# Patient Record
Sex: Male | Born: 1975 | Race: White | Hispanic: No | Marital: Married | Smoking: Never smoker
Health system: Southern US, Community
[De-identification: ages and names within clinical notes are randomized; demographics above are authoritative.]

## PROBLEM LIST (undated history)

## (undated) DIAGNOSIS — B958 Unspecified staphylococcus as the cause of diseases classified elsewhere: Secondary | ICD-10-CM

## (undated) DIAGNOSIS — E785 Hyperlipidemia, unspecified: Secondary | ICD-10-CM

## (undated) DIAGNOSIS — J309 Allergic rhinitis, unspecified: Secondary | ICD-10-CM

## (undated) DIAGNOSIS — M6530 Trigger finger, unspecified finger: Secondary | ICD-10-CM

## (undated) DIAGNOSIS — T7840XA Allergy, unspecified, initial encounter: Secondary | ICD-10-CM

## (undated) DIAGNOSIS — K649 Unspecified hemorrhoids: Secondary | ICD-10-CM

## (undated) DIAGNOSIS — I341 Nonrheumatic mitral (valve) prolapse: Secondary | ICD-10-CM

## (undated) HISTORY — DX: Allergic rhinitis, unspecified: J30.9

## (undated) HISTORY — DX: Unspecified staphylococcus as the cause of diseases classified elsewhere: B95.8

## (undated) HISTORY — DX: Hyperlipidemia, unspecified: E78.5

## (undated) HISTORY — DX: Unspecified hemorrhoids: K64.9

## (undated) HISTORY — PX: EYE SURGERY: SHX253

## (undated) HISTORY — DX: Trigger finger, unspecified finger: M65.30

## (undated) HISTORY — DX: Allergy, unspecified, initial encounter: T78.40XA

## (undated) HISTORY — DX: Nonrheumatic mitral (valve) prolapse: I34.1

---

## 2004-08-07 ENCOUNTER — Ambulatory Visit (HOSPITAL_COMMUNITY): Admission: RE | Admit: 2004-08-07 | Discharge: 2004-08-07 | Payer: Self-pay | Admitting: *Deleted

## 2009-11-30 HISTORY — PX: SEPTOPLASTY: SUR1290

## 2010-07-09 ENCOUNTER — Emergency Department (HOSPITAL_COMMUNITY): Admission: EM | Admit: 2010-07-09 | Discharge: 2009-11-10 | Payer: Self-pay | Admitting: Emergency Medicine

## 2010-09-01 HISTORY — PX: NASAL SINUS SURGERY: SHX719

## 2011-02-23 ENCOUNTER — Ambulatory Visit (INDEPENDENT_AMBULATORY_CARE_PROVIDER_SITE_OTHER): Payer: 59 | Admitting: General Surgery

## 2011-02-23 ENCOUNTER — Encounter (INDEPENDENT_AMBULATORY_CARE_PROVIDER_SITE_OTHER): Payer: Self-pay | Admitting: General Surgery

## 2011-02-23 VITALS — BP 92/70 | HR 72 | Temp 97.0°F

## 2011-02-23 DIAGNOSIS — K602 Anal fissure, unspecified: Secondary | ICD-10-CM

## 2011-02-23 NOTE — Progress Notes (Signed)
Subjective:     Patient ID: Kevin Chan, male   DOB: 09-Nov-1975, 35 y.o.   MRN: [id number]  HPI  Patient's had previous problems with hemorrhoids R. rubberbanded on 2 occasions area stated went about 3 months with no problems and then recently had a small amount of bleeding and also onset of pain in his bathroom habits are regular. Stools soft  Review of Systems     Objective:   Physical Exam Patient on rectal and anoscopic exam he's got a small fissure posteriorly that appears to be healing and he's not in bed spasm at this time. With anoscopic exam I find no hemorrhoids are large enough to need any rubber banding at this time and think the most recent episode of bleeding was secondary to the small acute posterior anal fissure   Assessment:        Plan:    Continues in the lidocaine ointment for pain antispasm cream can be used if needed and he has a new prescription previously [facility] clinic that he said he had problems with having loose bowel movements no controlling he preferred not use of packed cells during

## 2011-06-03 ENCOUNTER — Encounter: Payer: Self-pay | Admitting: *Deleted

## 2011-06-04 ENCOUNTER — Encounter: Payer: Self-pay | Admitting: Family Medicine

## 2011-06-04 ENCOUNTER — Ambulatory Visit (INDEPENDENT_AMBULATORY_CARE_PROVIDER_SITE_OTHER): Payer: 59 | Admitting: Family Medicine

## 2011-06-04 VITALS — BP 110/70 | HR 80 | Ht 71.5 in | Wt 169.0 lb

## 2011-06-04 DIAGNOSIS — Z0000 Encounter for general adult medical examination without abnormal findings: Secondary | ICD-10-CM

## 2011-06-04 DIAGNOSIS — E7800 Pure hypercholesterolemia, unspecified: Secondary | ICD-10-CM | POA: Insufficient documentation

## 2011-06-04 LAB — COMPREHENSIVE METABOLIC PANEL
ALT: 17 U/L (ref 0–53)
AST: 16 U/L (ref 0–37)
Albumin: 4.8 g/dL (ref 3.5–5.2)
Alkaline Phosphatase: 61 U/L (ref 39–117)
BUN: 21 mg/dL (ref 6–23)
CO2: 31 mEq/L (ref 19–32)
Calcium: 10 mg/dL (ref 8.4–10.5)
Chloride: 101 mEq/L (ref 96–112)
Creat: 0.81 mg/dL (ref 0.50–1.35)
Glucose, Bld: 80 mg/dL (ref 70–99)
Potassium: 4.3 mEq/L (ref 3.5–5.3)
Sodium: 139 mEq/L (ref 135–145)
Total Bilirubin: 0.7 mg/dL (ref 0.3–1.2)
Total Protein: 7.6 g/dL (ref 6.0–8.3)

## 2011-06-04 LAB — LIPID PANEL
Cholesterol: 271 mg/dL — ABNORMAL HIGH (ref 0–200)
HDL: 73 mg/dL (ref >39)
LDL Cholesterol: 169 mg/dL — ABNORMAL HIGH (ref 0–99)
Total CHOL/HDL Ratio: 3.7 Ratio
Triglycerides: 144 mg/dL (ref <150)
VLDL: 29 mg/dL (ref 0–40)

## 2011-06-04 NOTE — Patient Instructions (Signed)

## 2011-06-04 NOTE — Progress Notes (Signed)
Kevin Chan is a 35 y.o. male who presents for a complete physical.  He has the following concerns: Patient presents to follow up on his cholesterol.  Brings in labs from October and December 2011.  In October, he was on no cholesterol medications and LDL was 145, total 238, TG 168, HDL 49. He was started on 20 mg of simvastatin by Dr. Abigail Miyamoto.  Caused a lot of myalgias--hurting in neck, elbows and knees, felt sore like he had been doing yardwork.  Re-check 2 months later was only minimally improved, likely because he didn't take it the entire time.  Dose was recommended to be increased to 40mg , but due to side effects, did not take.  Has been following low cholesterol diet and is here for recheck.  In 2010, C-CRP was low risk (LDL 163, total 256).   Immunization History  Administered Date(s) Administered  . Influenza Split 05/30/2009, 05/27/2010, 05/12/2011  . Tdap 01/13/2009   Last colonoscopy: 2006 years ago with Dr. Luther Parody.  Internal hemorrhoids Last PSA: never Dentist: twice yearly Ophtho: yearly Exercise: "not enough", just walking playing golf  Past Medical History  Diagnosis Date  . Mitral valve prolapse   . Staph infection   . Hemorrhoid     internal  . Hyperlipidemia     previously had myalgias on Crestor and simvastatin  . Asthma   . Allergic rhinitis, cause unspecified     on immunotherapy through [facility]    Past Surgical History  Procedure Date  . Septoplasty 11/2009    turbinate reduction (Dr. Lazarus Salines)  . Nasal sinus surgery 09/01/10    [facility] Bane    History   Social History  . Marital Status: Married    Spouse Name: N/A    Number of Children: 2  . Years of Education: N/A   Occupational History  . financial advisor    Social History Main Topics  . Smoking status: Never Smoker   . Smokeless tobacco: Never Used  . Alcohol Use: 0.0 oz/week    2-3 Cans of beer per week     3 drinks per week, heavier with golf trips  . Drug Use: No  . Sexually  Active: Yes -- Male partner(s)   Other Topics Concern  . Not on file   Social History Narrative   Lives with wife, cat, son and daughter    Family History  Problem Relation Age of Onset  . Heart disease Father     bypass surgery(51 and 61)  . Hyperlipidemia Father   . Diabetes Father     borderline  . Lymphoma Mother   . Colon polyps Mother   . Depression Mother   . Migraines Mother   . Colon polyps Brother   . Cancer Maternal Grandmother 49    colon cancer  . Alzheimer's disease Maternal Grandmother     Current outpatient prescriptions:azelastine (ASTELIN) 137 MCG/SPRAY nasal spray, Place 1 spray into the nose 2 (two) times daily. Use in each nostril as directed , Disp: , Rfl: ;  fexofenadine (ALLEGRA) 180 MG tablet, Take 180 mg by mouth daily.  , Disp: , Rfl: ;  fish oil-omega-3 fatty acids 1000 MG capsule, Take 4 g by mouth daily.  , Disp: , Rfl: ;  fluticasone (FLONASE) 50 MCG/ACT nasal spray, Place 2 sprays into the nose daily.  , Disp: , Rfl:  mometasone (ASMANEX) 220 MCG/INH inhaler, Inhale 1 puff into the lungs daily.  , Disp: , Rfl: ;  Multiple Vitamins-Minerals (MULTIVITAMIN  WITH MINERALS) tablet, Take 1 tablet by mouth daily.  , Disp: , Rfl:   Allergies  Allergen Reactions  . Cashews (Nuts) Other (See Comments)    thrush   ROS: The patient denies anorexia, fever, weight changes (some intentional loss), headaches,  vision loss, decreased hearing, ear pain, hoarseness, chest pain, palpitations, dizziness, syncope, dyspnea on exertion, cough, swelling, nausea, vomiting, diarrhea, constipation, abdominal pain, melena, hematochezia, indigestion/heartburn, hematuria, incontinence, erectile dysfunction, nocturia, weakened urine stream, dysuria, genital lesions, joint pains, numbness, tingling, weakness, tremor, suspicious skin lesions, depression, anxiety, abnormal bleeding/bruising, or enlarged lymph nodes  PHYSICAL EXAM: BP 110/70  Pulse 80  Ht 5' 11.5" (1.816 m)  Wt  169 lb (76.658 kg)  BMI 23.24 kg/m2  General Appearance:    Alert, cooperative, no distress, appears stated age  Head:    Normocephalic, without obvious abnormality, atraumatic  Eyes:    PERRL, conjunctiva/corneas clear, EOM's intact, fundi    benign  Ears:    Normal TM's and external ear canals  Nose:   Nares normal, mucosa normal, no drainage or sinus   tenderness  Throat:   Lips, mucosa, and tongue normal; teeth and gums normal  Neck:   Supple, no lymphadenopathy;  thyroid:  no   enlargement/tenderness/nodules; no carotid   bruit or JVD  Back:    Spine nontender, no curvature, ROM normal, no CVA     tenderness  Lungs:     Clear to auscultation bilaterally without wheezes, rales or     ronchi; respirations unlabored  Chest Wall:    No tenderness or deformity   Heart:    Regular rate and rhythm, S1 and S2 normal, no murmur, rub   or gallop  Breast Exam:    No chest wall tenderness, masses or gynecomastia  Abdomen:     Soft, non-tender, nondistended, normoactive bowel sounds,    no masses, no hepatosplenomegaly  Genitalia:    Normal male external genitalia without lesions. Circumcized. Scrotal piercing. Testicles without masses.  No inguinal hernias.  Rectal:   Deferred due to age <40 and lack of symptoms  Extremities:   No clubbing, cyanosis or edema  Pulses:   2+ and symmetric all extremities  Skin:   Skin color, texture, turgor normal, no rashes or lesions  Lymph nodes:   Cervical, supraclavicular, and axillary nodes normal  Neurologic:   CNII-XII intact, normal strength, sensation and gait; reflexes 2+ and symmetric throughout          Psych:   Normal mood, affect, hygiene and grooming.     ASSESSMENT/PLAN:  1. Routine general medical examination at a health care facility  POCT Urinalysis Dipstick, Visual acuity screening  2. Pure hypercholesterolemia  Comprehensive metabolic panel, Lipid panel, CRP High sensitivity    Recommended at least 30 minutes of aerobic activity at  least 5 days/week; proper sunscreen use reviewed; healthy diet and alcohol recommendations (less than or equal to 2 drinks/day) reviewed; regular seatbelt use; changing batteries in smoke detectors. Self-testicular exams. Immunization recommendations discussed--UTD.  Colonoscopy recommendations reviewed--likely should have repeat at age 16, sooner for changes in bowel habits or other concerns.

## 2011-06-05 ENCOUNTER — Encounter: Payer: Self-pay | Admitting: Family Medicine

## 2011-06-05 LAB — HIGH SENSITIVITY CRP: CRP, High Sensitivity: 0.9 mg/L

## 2011-06-15 ENCOUNTER — Telehealth: Payer: Self-pay | Admitting: Family Medicine

## 2011-06-15 DIAGNOSIS — E7800 Pure hypercholesterolemia, unspecified: Secondary | ICD-10-CM

## 2011-06-15 MED ORDER — ATORVASTATIN CALCIUM 10 MG PO TABS: 10.0000 mg | ORAL_TABLET | Freq: Every day | ORAL | Status: DC

## 2011-06-15 NOTE — Telephone Encounter (Signed)
Rx was sent to pharmacy.  Please advise done

## 2011-06-15 NOTE — Telephone Encounter (Signed)
Pt called wants chol med Rx for Lipitor generic  [facility]

## 2011-06-16 ENCOUNTER — Telehealth: Payer: Self-pay | Admitting: *Deleted

## 2011-06-16 NOTE — Telephone Encounter (Signed)
Patient informed that meds were called in.

## 2011-06-29 ENCOUNTER — Telehealth: Payer: Self-pay | Admitting: Family Medicine

## 2011-06-30 ENCOUNTER — Other Ambulatory Visit: Payer: Self-pay | Admitting: *Deleted

## 2011-06-30 DIAGNOSIS — E7800 Pure hypercholesterolemia, unspecified: Secondary | ICD-10-CM

## 2011-06-30 MED ORDER — ATORVASTATIN CALCIUM 10 MG PO TABS: 10.0000 mg | ORAL_TABLET | Freq: Every day | ORAL | Status: DC

## 2011-07-02 NOTE — Telephone Encounter (Signed)
DONE

## 2011-08-05 ENCOUNTER — Telehealth: Payer: Self-pay | Admitting: Internal Medicine

## 2011-08-05 MED ORDER — ATORVASTATIN CALCIUM 20 MG PO TABS: 10.0000 mg | ORAL_TABLET | Freq: Every day | ORAL | Status: DC

## 2011-08-05 NOTE — Telephone Encounter (Signed)
Spoke with patient and he would like to try putting the rx through to pharmacy as explained. I sent to [facility] #[facility], 1/2/ tablet daily.

## 2011-08-05 NOTE — Telephone Encounter (Signed)
pt needs a refill on lipitor. but wants to know if he can get lipitor 20mg  and split it in half for a 90 day supply so it will be cheaper and last him 180 days. he has been getting lipitor 10 though. please call and let him know if you are able to do that or not. [facility]. Pt number is [phone]

## 2011-08-05 NOTE — Telephone Encounter (Signed)
You can try sending it as he requests, but need to put 1/2 tablet in directions, #90 as quantity.  It is possible that insurance will only let him have #45, in which case he is spending the same amount of money and also has to cut the pills.

## 2011-08-06 ENCOUNTER — Telehealth: Payer: Self-pay | Admitting: Internal Medicine

## 2011-08-06 MED ORDER — ATORVASTATIN CALCIUM 10 MG PO TABS: 10.0000 mg | ORAL_TABLET | Freq: Every day | ORAL | Status: DC

## 2011-08-06 NOTE — Telephone Encounter (Signed)
Ordered lipitor 10mg  #90 no refills to [facility]

## 2011-08-19 ENCOUNTER — Encounter: Payer: Self-pay | Admitting: Family Medicine

## 2011-08-19 ENCOUNTER — Ambulatory Visit (INDEPENDENT_AMBULATORY_CARE_PROVIDER_SITE_OTHER): Payer: 59 | Admitting: Family Medicine

## 2011-08-19 VITALS — BP 128/68 | HR 60 | Temp 98.7°F | Ht 71.5 in | Wt 175.0 lb

## 2011-08-19 DIAGNOSIS — J069 Acute upper respiratory infection, unspecified: Secondary | ICD-10-CM

## 2011-08-19 NOTE — Patient Instructions (Signed)
Drink plenty of fluids.  Continue your regular medications.  Add decongestants and mucinex.  Call or return next week if symptoms are worsening.    Neosporin or bacitracin to wound until it heals. Your tetanus is up to date.

## 2011-08-19 NOTE — Progress Notes (Signed)
Chief complaint:  Cough and sore throat, drainage and achy, mucus is yellowish green x 3 days. No fever  HPI:  3 days ago began with runny nose, sore throat and cough.  Having yellow-green mucus from nose, phlegm is clear.  He is using nasal saline, Flonase and Allegra. Complaining of fatigue. +sick contacts--both of his young children were sick with URI's and pinkeye.  Past Medical History  Diagnosis Date  . Mitral valve prolapse   . Staph infection   . Hemorrhoid     internal  . Hyperlipidemia     previously had myalgias on Crestor and simvastatin  . Asthma   . Allergic rhinitis, cause unspecified     on immunotherapy through [facility]    Past Surgical History  Procedure Date  . Septoplasty 11/2009    turbinate reduction (Dr. Lazarus Salines)  . Nasal sinus surgery 09/01/10    [facility]    History   Social History  . Marital Status: Married    Spouse Name: N/A    Number of Children: 2  . Years of Education: N/A   Occupational History  . financial advisor    Social History Main Topics  . Smoking status: Never Smoker   . Smokeless tobacco: Never Used  . Alcohol Use: 0.0 oz/week    2-3 Cans of beer per week     3 drinks per week, heavier with golf trips  . Drug Use: No  . Sexually Active: Yes -- Male partner(s)   Other Topics Concern  . Not on file   Social History Narrative   Lives with wife, cat, son and daughter    Family History  Problem Relation Age of Onset  . Heart disease Father     bypass surgery(51 and 61)  . Hyperlipidemia Father   . Diabetes Father     borderline  . Lymphoma Mother   . Colon polyps Mother   . Depression Mother   . Migraines Mother   . Hypertension Mother   . Cancer Mother     nonhodgkin's lymphoma  . Colon polyps Brother   . Cancer Maternal Grandmother 20    colon cancer  . Alzheimer's disease Maternal Grandmother    Current Outpatient Prescriptions on File Prior to Visit  Medication Sig Dispense Refill  . atorvastatin  (LIPITOR) 10 MG tablet Take 1 tablet (10 mg total) by mouth daily.  90 tablet  0  . azelastine (ASTELIN) 137 MCG/SPRAY nasal spray Place 1 spray into the nose 2 (two) times daily. Use in each nostril as directed       . fexofenadine (ALLEGRA) 180 MG tablet Take 180 mg by mouth daily.        . fluticasone (FLONASE) 50 MCG/ACT nasal spray Place 2 sprays into the nose daily.        . mometasone (ASMANEX) 220 MCG/INH inhaler Inhale 1 puff into the lungs daily.        . Multiple Vitamins-Minerals (MULTIVITAMIN WITH MINERALS) tablet Take 1 tablet by mouth daily.         Allergies  Allergen Reactions  . Cashews (Nuts) Other (See Comments)    thrush   ROS: Denies fevers and chills. Has some ear pressure with use of saline rinses. Denies shortness of breath, skin rash, GI complaints  PHYSICAL EXAM: BP 128/68  Pulse 60  Temp(Src) 98.7 F (37.1 C) (Oral)  Ht 5' 11.5" (1.816 m)  Wt 175 lb (79.379 kg)  BMI 24.07 kg/m2 Well developed, well appearing male  in no distress HEENT: PERRL, EOMI, conjunctiva clear.  Sinuses nontender.  Nasal mucosa moderately edematous with some white-yellow drainage bilaterally.  TM's and EAC's normal. OP slightly red (anterior tonsillar pillars, and posteriorly with some cobblestoning), normal tonsils. Neck: no lymphadenopathy or mass Heart: regular rate and rhythm Lungs: clear bilaterally Skin: no rash Psych: normal mood  ASSESSMENT/PLAN: 1. URI (upper respiratory infection)   Continue supportive measures--drink plenty of fluids, Mucinex prn, rest. Continue allegra, flonase and sinus rinses  Call or f/u next week if symptoms persist/worsen

## 2011-08-26 ENCOUNTER — Telehealth: Payer: Self-pay | Admitting: Internal Medicine

## 2011-08-26 DIAGNOSIS — J019 Acute sinusitis, unspecified: Secondary | ICD-10-CM

## 2011-08-26 MED ORDER — AMOXICILLIN-POT CLAVULANATE 875-125 MG PO TABS: 1.0000 | ORAL_TABLET | Freq: Two times a day (BID) | ORAL | Status: AC

## 2011-08-26 NOTE — Telephone Encounter (Signed)
Advise pt Augmentin sent to [facility] for him

## 2011-08-26 NOTE — Telephone Encounter (Signed)
Left message for pt letting him know that Dr.Knapp called in Augmentin to [facility] for him.

## 2011-09-06 ENCOUNTER — Telehealth: Payer: Self-pay | Admitting: *Deleted

## 2011-09-06 DIAGNOSIS — J069 Acute upper respiratory infection, unspecified: Secondary | ICD-10-CM

## 2011-09-06 MED ORDER — METHYLPREDNISOLONE 4 MG PO KIT: PACK | ORAL | Status: AC

## 2011-09-06 MED ORDER — AMOXICILLIN-POT CLAVULANATE 875-125 MG PO TABS: 1.0000 | ORAL_TABLET | Freq: Two times a day (BID) | ORAL | Status: AC

## 2011-09-06 NOTE — Telephone Encounter (Signed)
Patient called to say that he thinks he still has infection, started feeling better after the fourth day of Augmentin but then symptoms came back. His nasal discharge is mostly clear but has really bad sinus pressure, can he have some type of steroid to help clear this up, either injection or oral? Please advise. Thanks.

## 2011-09-06 NOTE — Telephone Encounter (Signed)
Did he complete 10 day course of Augmentin? If so, and symptoms recurring since off meds, call in additional 7 days of Augmentin.  Okay to also call in medrol dosepak.  He needs to f/u if symptoms don't resolve (no additional meds to be called in without OV)

## 2011-09-06 NOTE — Telephone Encounter (Signed)
Spoke with patient and he did indeed finish the 10 day course of Augmentin. His symptoms have recurred so I will call in another 7 days and also call in a medrol dosepak. He understands that if his symptoms do not improve after this round of meds that he needs to schedule office visit with Dr.Knapp to follow up. Meds called into [facility].

## 2011-09-10 ENCOUNTER — Other Ambulatory Visit: Payer: 59

## 2011-09-10 ENCOUNTER — Other Ambulatory Visit: Payer: Self-pay | Admitting: Family Medicine

## 2011-09-10 DIAGNOSIS — E7800 Pure hypercholesterolemia, unspecified: Secondary | ICD-10-CM

## 2011-09-11 LAB — LIPID PANEL
Cholesterol: 200 mg/dL (ref 0–200)
HDL: 53 mg/dL (ref >39)
LDL Cholesterol: 107 mg/dL — ABNORMAL HIGH (ref 0–99)
Total CHOL/HDL Ratio: 3.8 Ratio
Triglycerides: 200 mg/dL — ABNORMAL HIGH (ref <150)
VLDL: 40 mg/dL (ref 0–40)

## 2011-09-13 LAB — HEPATIC FUNCTION PANEL
ALT: 20 U/L (ref 0–53)
AST: 14 U/L (ref 0–37)
Albumin: 4.5 g/dL (ref 3.5–5.2)
Alkaline Phosphatase: 58 U/L (ref 39–117)
Bilirubin, Direct: 0.1 mg/dL (ref 0.0–0.3)
Indirect Bilirubin: 0.6 mg/dL (ref 0.0–0.9)
Total Bilirubin: 0.7 mg/dL (ref 0.3–1.2)
Total Protein: 7.1 g/dL (ref 6.0–8.3)

## 2011-12-03 ENCOUNTER — Other Ambulatory Visit: Payer: Self-pay | Admitting: Family Medicine

## 2011-12-03 DIAGNOSIS — E7800 Pure hypercholesterolemia, unspecified: Secondary | ICD-10-CM

## 2011-12-03 NOTE — Telephone Encounter (Signed)
Done.  Labs and OV due again in August

## 2011-12-03 NOTE — Telephone Encounter (Signed)
Is this ok he had labs done in Feb. But I dont see an office visit after

## 2012-01-31 ENCOUNTER — Encounter: Payer: Self-pay | Admitting: Family Medicine

## 2012-01-31 ENCOUNTER — Ambulatory Visit (INDEPENDENT_AMBULATORY_CARE_PROVIDER_SITE_OTHER): Payer: 59 | Admitting: Family Medicine

## 2012-01-31 ENCOUNTER — Telehealth: Payer: Self-pay | Admitting: *Deleted

## 2012-01-31 VITALS — BP 102/70 | HR 64 | Temp 98.2°F | Ht 71.5 in | Wt 175.0 lb

## 2012-01-31 DIAGNOSIS — J019 Acute sinusitis, unspecified: Secondary | ICD-10-CM

## 2012-01-31 DIAGNOSIS — E7800 Pure hypercholesterolemia, unspecified: Secondary | ICD-10-CM

## 2012-01-31 MED ORDER — AZITHROMYCIN 500 MG PO TABS: 500.0000 mg | ORAL_TABLET | Freq: Every day | ORAL | Status: AC

## 2012-01-31 NOTE — Telephone Encounter (Signed)
Patient called and wanted to know if you would call him in an abx for a sinus infection? He has had pain/pressure and drainage 7-10 that is not going away. He has used netti pot along with Flonase. I offered appt and he stated that sometimes you just call something in for him, wanted to see if you were able to do so, if not he stated that he would come in. He is requesting Augmentin and a steroid pack. Thanks. [facility] pharmacy.

## 2012-01-31 NOTE — Telephone Encounter (Signed)
appt today

## 2012-01-31 NOTE — Patient Instructions (Addendum)
Take the azithromycin for three days.  Remember that it continues to work for a full course.  Continue sinus rinses twice daily (can decrease to once daily when symptoms are better).  Continue topical steroid for poison ivy. If you start get new lesions, or spreading, then call for oral steroids.

## 2012-01-31 NOTE — Progress Notes (Signed)
Chief Complaint  Patient presents with  . Sinusitis    sinus infection 7-10 days. Netti pot and flonase not helping. Also has had poison ivy x 10 days, using clobetasol but not working.    HPI: Began a week or so ago with some achiness in his sinuses/cheeks, pain with leaning forward.  At that time, didn't have any purulent drainage or fevers. Has been using flonase and sinus rinses regularly.  The past 2-3 days has gotten some yellow mucus when he blows nose, and after sinus rinses.  This morning, had severe pain when doing sinus rinse this morning, had a lot of resistance when squirting up the left nostril, and had pain into his teeth.  Got out big, thick, nasty yellow "goober", and was very painful into L cheek.  Did rinses 3 times today, didn't seem to hurt as much, but continued to get discolored mucus.  Denies fevers.  Denies sore throat, cough, shortness of breath. He has been compliant with medications and allergy shots.  Got poison ivy 2 weeks ago from yardwork--L hip, right axilla, R lower leg, some on chest. Using clobetasol, and slowly is helping.  Hasn't had any new lesions develop, seems to be improving.  Past Medical History  Diagnosis Date  . Mitral valve prolapse   . Staph infection   . Hemorrhoid     internal  . Hyperlipidemia     previously had myalgias on Crestor and simvastatin  . Asthma   . Allergic rhinitis, cause unspecified     on immunotherapy through [facility]   Past Surgical History  Procedure Date  . Septoplasty 11/2009    turbinate reduction (Dr. Lazarus Salines)  . Nasal sinus surgery 09/01/10    [facility]   Current Outpatient Prescriptions on File Prior to Visit  Medication Sig Dispense Refill  . atorvastatin (LIPITOR) 10 MG tablet TAKE 1 TABLET (10 MG TOTAL) BY MOUTH DAILY.  90 tablet  0  . azelastine (ASTELIN) 137 MCG/SPRAY nasal spray Place 1 spray into the nose 2 (two) times daily. Use in each nostril as directed       . fexofenadine (ALLEGRA) 180 MG tablet  Take 180 mg by mouth daily.        . fluticasone (FLONASE) 50 MCG/ACT nasal spray Place 2 sprays into the nose daily.        . mometasone (ASMANEX) 220 MCG/INH inhaler Inhale 1 puff into the lungs daily.        . Multiple Vitamins-Minerals (MULTIVITAMIN WITH MINERALS) tablet Take 1 tablet by mouth daily.         Allergies  Allergen Reactions  . Cashews (Nuts) Other (See Comments)    thrush   ROS:  Denies fevers, sore throat, cough, shortness of breath, myalgias, rashes other than poison ivy exposure, vomiting, diarrhea, or other concerns.  PHYSICAL EXAM: BP 102/70  Pulse 64  Temp 98.2 F (36.8 C) (Oral)  Ht 5' 11.5" (1.816 m)  Wt 175 lb (79.379 kg)  BMI 24.07 kg/m2 Well developed, well appearing male in no distress HEENT:  PERRL, EOMI, conjunctiva clear.  TM's and EAC's normal. Nasal mucosa appears normal.  Mild tenderness at left maxillary sinus.  OP clear, mild cobblestoning posteriorly Neck: no lymphadenopathy or mass Heart: regular rate and rhythm without murmur Lungs: clear bilaterally Skin: L hip--patch of erythema, healing.  No true vesicles left.  Some scattered healing vesicles/papules on chest, leg, arm.  ASSESSMENT/PLAN: 1. Acute sinusitis  azithromycin (ZITHROMAX) 500 MG tablet  2. Pure  hypercholesterolemia  Lipid panel, Hepatic function panel   Fasting labs due in August (labs only, letter if normal)

## 2012-02-09 ENCOUNTER — Telehealth: Payer: Self-pay | Admitting: *Deleted

## 2012-02-09 MED ORDER — PREDNISONE 10 MG PO TABS: ORAL_TABLET | ORAL | Status: DC

## 2012-02-09 NOTE — Telephone Encounter (Signed)
Please advise pt that rx was sent--and please review directions and make sure he understands (60/50/40/30/20/20/20/10/10/10--see how directions were written on label and make sure it is clear to him)

## 2012-02-09 NOTE — Telephone Encounter (Signed)
Pt advised.

## 2012-02-09 NOTE — Telephone Encounter (Signed)
Patient called and stated that his poison ivy is not really going away with steroid cream. Could you please call in steroid pack.

## 2012-03-04 ENCOUNTER — Other Ambulatory Visit: Payer: Self-pay | Admitting: Family Medicine

## 2012-03-07 ENCOUNTER — Other Ambulatory Visit: Payer: 59

## 2012-03-07 DIAGNOSIS — E7800 Pure hypercholesterolemia, unspecified: Secondary | ICD-10-CM

## 2012-03-07 LAB — LIPID PANEL
Cholesterol: 171 mg/dL (ref 0–200)
HDL: 46 mg/dL (ref >39)
LDL Cholesterol: 88 mg/dL (ref 0–99)
Total CHOL/HDL Ratio: 3.7 Ratio
Triglycerides: 185 mg/dL — ABNORMAL HIGH (ref <150)
VLDL: 37 mg/dL (ref 0–40)

## 2012-03-07 LAB — HEPATIC FUNCTION PANEL
ALT: 20 U/L (ref 0–53)
AST: 14 U/L (ref 0–37)
Albumin: 4.5 g/dL (ref 3.5–5.2)
Alkaline Phosphatase: 56 U/L (ref 39–117)
Bilirubin, Direct: 0.1 mg/dL (ref 0.0–0.3)
Indirect Bilirubin: 0.5 mg/dL (ref 0.0–0.9)
Total Bilirubin: 0.6 mg/dL (ref 0.3–1.2)
Total Protein: 6.9 g/dL (ref 6.0–8.3)

## 2012-03-08 ENCOUNTER — Encounter: Payer: Self-pay | Admitting: Family Medicine

## 2012-05-26 ENCOUNTER — Other Ambulatory Visit: Payer: Self-pay | Admitting: *Deleted

## 2012-05-26 DIAGNOSIS — E7800 Pure hypercholesterolemia, unspecified: Secondary | ICD-10-CM

## 2012-05-26 MED ORDER — ATORVASTATIN CALCIUM 10 MG PO TABS: 10.0000 mg | ORAL_TABLET | Freq: Every day | ORAL | Status: DC

## 2012-11-23 ENCOUNTER — Telehealth: Payer: Self-pay | Admitting: Family Medicine

## 2012-11-23 DIAGNOSIS — E7800 Pure hypercholesterolemia, unspecified: Secondary | ICD-10-CM

## 2012-11-23 MED ORDER — ATORVASTATIN CALCIUM 10 MG PO TABS: 10.0000 mg | ORAL_TABLET | Freq: Every day | ORAL | Status: DC

## 2012-11-23 NOTE — Telephone Encounter (Signed)
done

## 2013-01-29 ENCOUNTER — Ambulatory Visit (INDEPENDENT_AMBULATORY_CARE_PROVIDER_SITE_OTHER): Payer: BC Managed Care – PPO | Admitting: Family Medicine

## 2013-01-29 ENCOUNTER — Telehealth: Payer: Self-pay | Admitting: *Deleted

## 2013-01-29 DIAGNOSIS — Z5320 Procedure and treatment not carried out because of patient's decision for unspecified reasons: Secondary | ICD-10-CM

## 2013-01-29 NOTE — Progress Notes (Signed)
Patient ID: Kevin Chan, male   DOB: 05/19/1976, 37 y.o.   MRN: [id number]  Patient left without being seen today

## 2013-01-29 NOTE — Telephone Encounter (Signed)
Spoke with patient and he told me that he rescheduled already to see Dr.Knapp and he would prefer her to manage if possible.

## 2013-01-29 NOTE — Telephone Encounter (Signed)
Patient was scheduled for sinus infection visit @ 12:00. Had to leave due to having to be somewhere. Wants to know if he should reschedule or if you can call him in an abx and/or prednisone. He states that he has a sinus infection, drainage and slight cough. He just finished a round of doxy from his ENT but doesn't feel 100%.

## 2013-01-29 NOTE — Telephone Encounter (Signed)
He should probably f/u with his ENT, since they recently treated him.  Doubt that it would be an infection if just treated, unless length of treatment wasn't enough.  He should probably try running it by his ENT first, to see if they will manage, otherwise needs to r/s with me

## 2013-01-31 ENCOUNTER — Ambulatory Visit: Payer: BC Managed Care – PPO | Admitting: Family Medicine

## 2013-02-12 ENCOUNTER — Telehealth: Payer: Self-pay | Admitting: Internal Medicine

## 2013-02-12 DIAGNOSIS — E7800 Pure hypercholesterolemia, unspecified: Secondary | ICD-10-CM

## 2013-02-12 MED ORDER — ATORVASTATIN CALCIUM 10 MG PO TABS: 10.0000 mg | ORAL_TABLET | Freq: Every day | ORAL | Status: DC

## 2013-02-12 NOTE — Telephone Encounter (Signed)
Refill request for atorvastatin 10mg  #90 to [facility]

## 2013-02-12 NOTE — Telephone Encounter (Signed)
Done and pt scheduled CPE for 07/05/13, he will come in fasting.

## 2013-05-28 ENCOUNTER — Telehealth: Payer: Self-pay | Admitting: Family Medicine

## 2013-05-28 ENCOUNTER — Other Ambulatory Visit: Payer: Self-pay | Admitting: *Deleted

## 2013-05-28 DIAGNOSIS — E7800 Pure hypercholesterolemia, unspecified: Secondary | ICD-10-CM

## 2013-05-28 MED ORDER — ATORVASTATIN CALCIUM 10 MG PO TABS: 10.0000 mg | ORAL_TABLET | Freq: Every day | ORAL | Status: DC

## 2013-05-28 NOTE — Telephone Encounter (Signed)
Done

## 2013-05-28 NOTE — Telephone Encounter (Signed)
[facility] req refill Atorvastatin Calcium Tab 90 day supply

## 2013-07-05 ENCOUNTER — Ambulatory Visit (INDEPENDENT_AMBULATORY_CARE_PROVIDER_SITE_OTHER): Payer: BC Managed Care – PPO | Admitting: Family Medicine

## 2013-07-05 ENCOUNTER — Encounter: Payer: Self-pay | Admitting: Family Medicine

## 2013-07-05 VITALS — BP 108/70 | HR 68 | Ht 71.5 in | Wt 171.0 lb

## 2013-07-05 DIAGNOSIS — Z0000 Encounter for general adult medical examination without abnormal findings: Secondary | ICD-10-CM

## 2013-07-05 DIAGNOSIS — E7800 Pure hypercholesterolemia, unspecified: Secondary | ICD-10-CM

## 2013-07-05 LAB — COMPREHENSIVE METABOLIC PANEL
ALT: 30 U/L (ref 0–53)
AST: 17 U/L (ref 0–37)
Albumin: 4.4 g/dL (ref 3.5–5.2)
Alkaline Phosphatase: 61 U/L (ref 39–117)
BUN: 20 mg/dL (ref 6–23)
CO2: 26 mEq/L (ref 19–32)
Calcium: 9.2 mg/dL (ref 8.4–10.5)
Chloride: 104 mEq/L (ref 96–112)
Creat: 0.7 mg/dL (ref 0.50–1.35)
Glucose, Bld: 89 mg/dL (ref 70–99)
Potassium: 4.2 mEq/L (ref 3.5–5.3)
Sodium: 139 mEq/L (ref 135–145)
Total Bilirubin: 0.4 mg/dL (ref 0.3–1.2)
Total Protein: 6.8 g/dL (ref 6.0–8.3)

## 2013-07-05 LAB — LIPID PANEL
Cholesterol: 173 mg/dL (ref 0–200)
HDL: 55 mg/dL (ref >39)
LDL Cholesterol: 99 mg/dL (ref 0–99)
Total CHOL/HDL Ratio: 3.1 Ratio
Triglycerides: 93 mg/dL (ref <150)
VLDL: 19 mg/dL (ref 0–40)

## 2013-07-05 NOTE — Progress Notes (Signed)
Chief Complaint  Patient presents with  . Annual Exam    annual exam, labs drawn this am. No  major concerns. All meds reconciled. Is scheduled for vasectomy consult in January with Dr.Dahlstedt.    Kevin Chan is a 37 y.o. male who presents for a complete physical.  He is also here to follow up on hyperlipidemia, past due for fasting labs.  He is following low cholesterol diet, and compliant with meds without side effects.  Immunization History  Administered Date(s) Administered  . Influenza Split 05/30/2009, 05/27/2010, 05/12/2011  . Tdap 01/13/2009   Last colonoscopy: 2006 with Dr. Luther Parody. Internal hemorrhoids  Last PSA: never  Dentist: twice yearly  Ophtho: yearly  Exercise: "not enough", just walking playing golf (once a week, but not during winter)  Past Medical History  Diagnosis Date  . Mitral valve prolapse   . Staph infection   . Hemorrhoid     internal  . Hyperlipidemia     previously had myalgias on Crestor and simvastatin  . Asthma   . Allergic rhinitis, cause unspecified     on immunotherapy through [place]    Past Surgical History  Procedure Laterality Date  . Septoplasty  11/2009    turbinate reduction (Dr. Lazarus Salines)  . Nasal sinus surgery  09/01/10    Kendell Bane    History   Social History  . Marital Status: Married    Spouse Name: N/A    Number of Children: 2  . Years of Education: N/A   Occupational History  . financial advisor    Social History Main Topics  . Smoking status: Never Smoker   . Smokeless tobacco: Never Used  . Alcohol Use: 0.0 oz/week    2-3 Cans of beer per week     Comment: 1-3 drinks per week, heavier with golf trips  . Drug Use: No  . Sexual Activity: Yes    Partners: Female   Other Topics Concern  . Not on file   Social History Narrative   Lives with wife, cat, son and daughter    Family History  Problem Relation Age of Onset  . Heart disease Father     bypass surgery(51 and 61)  . Hyperlipidemia Father    . Diabetes Father     borderline, improved after weight loss  . Lymphoma Mother   . Colon polyps Mother   . Depression Mother   . Migraines Mother   . Hypertension Mother   . Cancer Mother     nonhodgkin's lymphoma  . Colon polyps Brother 7  . Cancer Maternal Grandmother 61    colon cancer  . Alzheimer's disease Maternal Grandmother     Current outpatient prescriptions:amoxicillin-clavulanate (AUGMENTIN) 875-125 MG per tablet, , Disp: , Rfl: ;  atorvastatin (LIPITOR) 10 MG tablet, Take 1 tablet (10 mg total) by mouth daily., Disp: 90 tablet, Rfl: 0;  Coenzyme Q10 300 MG CAPS, Take 600 mg by mouth as needed., Disp: , Rfl: ;  fexofenadine (ALLEGRA) 180 MG tablet, Take 180 mg by mouth daily.  , Disp: , Rfl:  fluticasone (FLONASE) 50 MCG/ACT nasal spray, Place 2 sprays into the nose daily.  , Disp: , Rfl: ;  mometasone (ASMANEX) 220 MCG/INH inhaler, Inhale 1 puff into the lungs daily.  , Disp: , Rfl: ;  montelukast (SINGULAIR) 10 MG tablet, Take 10 mg by mouth at bedtime. , Disp: , Rfl: ;  sulfacetamide (BLEPH-10) 10 % ophthalmic solution, Eye drops for pink eye, Disp: , Rfl:  azelastine (ASTELIN) 137 MCG/SPRAY nasal spray, Place 1 spray into the nose 2 (two) times daily. Use in each nostril as directed , Disp: , Rfl: ;  clobetasol cream (TEMOVATE) 0.05 %, Apply 1 application topically 2 (two) times daily., Disp: , Rfl: ;  EPIPEN 2-PAK 0.3 MG/0.3ML SOAJ injection, , Disp: , Rfl: ;  Multiple Vitamins-Minerals (MULTIVITAMIN WITH MINERALS) tablet, Take 1 tablet by mouth daily.  , Disp: , Rfl:   Allergies  Allergen Reactions  . Cashews [Nuts] Other (See Comments)    thrush   ROS: The patient denies anorexia, fever, weight changes, headaches, vision loss, decreased hearing, ear pain, hoarseness, chest pain, palpitations, dizziness, syncope, dyspnea on exertion, cough, swelling, nausea, vomiting, diarrhea, constipation, abdominal pain, melena, hematochezia, indigestion/heartburn, hematuria,  incontinence, erectile dysfunction, nocturia, weakened urine stream, dysuria, genital lesions, joint pains, numbness, tingling, weakness, tremor, suspicious skin lesions, depression, anxiety, abnormal bleeding/bruising, or enlarged lymph nodes Allergies and asthma are well controlled with his current regimen Tired--related to wife's snoring and 2 young children  PHYSICAL EXAM: BP 108/70  Pulse 68  Ht 5' 11.5" (1.816 m)  Wt 171 lb (77.565 kg)  BMI 23.52 kg/m2  General Appearance:  Alert, cooperative, no distress, appears stated age   Head:  Normocephalic, without obvious abnormality, atraumatic   Eyes:  PERRL, conjunctiva/corneas clear, EOM's intact, fundi  benign   Ears:  Normal TM's and external ear canals   Nose:  Nares normal, mucosa normal, no drainage or sinus tenderness   Throat:  Lips, mucosa, and tongue normal; teeth and gums normal   Neck:  Supple, no lymphadenopathy; thyroid: no enlargement/tenderness/nodules; no carotid  bruit or JVD   Back:  Spine nontender, no curvature, ROM normal, no CVA tenderness   Lungs:  Clear to auscultation bilaterally without wheezes, rales or ronchi; respirations unlabored   Chest Wall:  No tenderness or deformity   Heart:  Regular rate and rhythm, S1 and S2 normal, no murmur, rub  or gallop   Breast Exam:  No chest wall tenderness, masses or gynecomastia   Abdomen:  Soft, non-tender, nondistended, normoactive bowel sounds,  no masses, no hepatosplenomegaly   Genitalia:  Normal male external genitalia without lesions. Circumcized. Scrotal piercing. Testicles without masses. No inguinal hernias.   Rectal:  Deferred due to age <40 and lack of symptoms   Extremities:  No clubbing, cyanosis or edema   Pulses:  2+ and symmetric all extremities   Skin:  Skin color, texture, turgor normal, no rashes or lesions   Lymph nodes:  Cervical, supraclavicular, and axillary nodes normal   Neurologic:  CNII-XII intact, normal strength, sensation and gait;  reflexes 2+ and symmetric throughout          Psych: Normal mood, affect, hygiene and grooming.   ASSESSMENT/PLAN:  Routine general medical examination at a health care facility - Plan: Visual acuity screening, Comprehensive metabolic panel  Pure hypercholesterolemia - Plan: Lipid panel, Comprehensive metabolic panel  Recommended at least 30 minutes of aerobic activity at least 5 days/week; proper sunscreen use reviewed; healthy diet and alcohol recommendations (less than or equal to 2 drinks/day) reviewed; regular seatbelt use; changing batteries in smoke detectors. Self-testicular exams. Immunization recommendations discussed--UTD. Colonoscopy recommendations reviewed--likely should have repeat at age 5, sooner for changes in bowel habits or other concerns. Recommend that patient check with [facility] for their recommendations.  If lipids are high, pt prefers dietary trial and recheck rather than changing dose

## 2013-07-05 NOTE — Patient Instructions (Signed)

## 2013-07-30 ENCOUNTER — Ambulatory Visit (INDEPENDENT_AMBULATORY_CARE_PROVIDER_SITE_OTHER): Payer: BC Managed Care – PPO | Admitting: Family Medicine

## 2013-07-30 ENCOUNTER — Encounter: Payer: Self-pay | Admitting: Family Medicine

## 2013-07-30 VITALS — BP 110/70 | HR 72 | Temp 99.0°F | Wt 170.0 lb

## 2013-07-30 DIAGNOSIS — R05 Cough: Secondary | ICD-10-CM

## 2013-07-30 DIAGNOSIS — R059 Cough, unspecified: Secondary | ICD-10-CM

## 2013-07-30 DIAGNOSIS — J019 Acute sinusitis, unspecified: Secondary | ICD-10-CM

## 2013-07-30 DIAGNOSIS — J209 Acute bronchitis, unspecified: Secondary | ICD-10-CM

## 2013-07-30 MED ORDER — BENZONATATE 200 MG PO CAPS: 200.0000 mg | ORAL_CAPSULE | Freq: Three times a day (TID) | ORAL | Status: DC | PRN

## 2013-07-30 MED ORDER — AZITHROMYCIN 250 MG PO TABS: ORAL_TABLET | ORAL | Status: DC

## 2013-07-30 MED ORDER — HYDROCOD POLST-CHLORPHEN POLST 10-8 MG/5ML PO LQCR: 5.0000 mL | Freq: Every evening | ORAL | Status: DC | PRN

## 2013-07-30 NOTE — Patient Instructions (Signed)
  Sinusitis/bronchitis as complication from recent influenza.  Treat with z-pak. Take 2 tablets today, then 1 daily for the next 4 days.  It continues to work for a total of 10 days. Continue mucinex to help loosen the phlegm. Drink plenty of fluids. Take the tussionex at bedtime if needed for cough--will make you sleepy and lasts for 12 hours, be careful with early morning driving. You can use the tessalon during the day as needed for cough (won't cause sedation).

## 2013-07-30 NOTE — Progress Notes (Signed)
Chief Complaint  Patient presents with  . lingering cough    lingering cough little over week. whole family is sick, pt got tamiflu and finished it up on christmas and still not better   He took Tamiflu from 12/20-25 (after he developed symptoms within a day of his daughter being diagnosed with influenza A).  Coughing seemed to get worse toward the end of the Tamiflu course, and persists.  He feels a rattle in his chest at night, having a lot of cough.  Cough is less productive than before and energy is improving, but still getting some discolored mucus and phlegm.  He has been taking Mucinex.  Stopped Delsym about 3-4 days ago. He has needed his rescue inhaler some (at night, 12/22-25).  He had high fevers with the flu, but feels like fevers had completely resolved, but periodically feels hot and sweaty.  No longer achey.  Has some ear fullness bilaterally.  Nasal mucus is 40% yellow-green, and the rest is clear.  Phlegm is about the same.  He has a lingering cough, and was sent home from work by his colleagues.  Past Medical History  Diagnosis Date  . Mitral valve prolapse   . Staph infection   . Hemorrhoid     internal  . Hyperlipidemia     previously had myalgias on Crestor and simvastatin  . Asthma   . Allergic rhinitis, cause unspecified     on immunotherapy through [place] [facility]   Past Surgical History  Procedure Laterality Date  . Septoplasty  11/2009    turbinate reduction (Dr. Lazarus Salines)  . Nasal sinus surgery  09/01/10    Kendell Bane   History   Social History  . Marital Status: Married    Spouse Name: N/A    Number of Children: 2  . Years of Education: N/A   Occupational History  . financial advisor    Social History Main Topics  . Smoking status: Never Smoker   . Smokeless tobacco: Never Used  . Alcohol Use: 0.0 oz/week    2-3 Cans of beer per week     Comment: 1-3 drinks per week, heavier with golf trips  . Drug Use: No  . Sexual Activity: Yes    Partners: Female    Other Topics Concern  . Not on file   Social History Narrative   Lives with wife, cat, son and daughter   Current outpatient prescriptions:atorvastatin (LIPITOR) 10 MG tablet, Take 1 tablet (10 mg total) by mouth daily., Disp: 90 tablet, Rfl: 0;  azelastine (ASTELIN) 137 MCG/SPRAY nasal spray, Place 1 spray into the nose 2 (two) times daily. Use in each nostril as directed , Disp: , Rfl: ;  Coenzyme Q10 300 MG CAPS, Take 600 mg by mouth as needed., Disp: , Rfl: ;  EPIPEN 2-PAK 0.3 MG/0.3ML SOAJ injection, , Disp: , Rfl:  fexofenadine (ALLEGRA) 180 MG tablet, Take 180 mg by mouth daily.  , Disp: , Rfl: ;  fluticasone (FLONASE) 50 MCG/ACT nasal spray, Place 2 sprays into the nose daily.  , Disp: , Rfl: ;  mometasone (ASMANEX) 220 MCG/INH inhaler, Inhale 1 puff into the lungs daily.  , Disp: , Rfl: ;  montelukast (SINGULAIR) 10 MG tablet, Take 10 mg by mouth at bedtime. , Disp: , Rfl:  Multiple Vitamins-Minerals (MULTIVITAMIN WITH MINERALS) tablet, Take 1 tablet by mouth daily.  , Disp: , Rfl:   Allergies  Allergen Reactions  . Cashews [Nuts] Other (See Comments)    thrush  ROS:  Denies headache, dizziness, chest pain, nausea, vomiting, diarrhea, bleeding/bruising.  +cough, fevers resolved, body aches improved, energy improving.  PHYSICAL EXAM: BP 110/70  Pulse 72  Temp(Src) 99 F (37.2 C) (Oral)  Wt 170 lb (77.111 kg) Dry, hacky cough.  Speaking easily, in no distress HEENT:  PERRL, EOMI, conjunctiva clear. TM's and EAC's normal. Nasal mucosa moderately edematous, some erythema, thick whitish yellow mucus.  OP with erythema posteriorly moist mucus membranes. Sinuses nontender Neck: no lymphadenopathy, thyromegaly or mass Heart: regular rate and rhythm without murmur Lungs: clear bilaterally.  No wheezes, rales or ronchi.  Some cough with forced expiration. Skin: no rash or bruising Neuro: alert and oriented, cranial nerves intact  ASSESSMENT/PLAN:  Acute sinusitis - Plan:  azithromycin (ZITHROMAX) 250 MG tablet  Acute bronchitis - Plan: azithromycin (ZITHROMAX) 250 MG tablet  Cough - Plan: benzonatate (TESSALON) 200 MG capsule, chlorpheniramine-HYDROcodone (TUSSIONEX PENNKINETIC ER) 10-8 MG/5ML LQCR  Sinusitis/bronchitis as complication from recent influenza.  Treat with z-pak. Take 2 tablets today, then 1 daily for the next 4 days.  It continues to work for a total of 10 days. Continue mucinex to help loosen the phlegm. Drink plenty of fluids. Take the tussionex at bedtime if needed for cough--will make you sleepy and lasts for 12 hours, be careful with early morning driving. You can use the tessalon during the day as needed for cough (won't cause sedation).

## 2013-08-02 HISTORY — PX: VASECTOMY: SHX75

## 2013-08-20 ENCOUNTER — Other Ambulatory Visit: Payer: Self-pay | Admitting: Family Medicine

## 2014-02-19 DIAGNOSIS — Z0279 Encounter for issue of other medical certificate: Secondary | ICD-10-CM

## 2014-03-05 ENCOUNTER — Telehealth: Payer: Self-pay | Admitting: Internal Medicine

## 2014-03-05 NOTE — Telephone Encounter (Signed)
Faxed over medical records to [facility] @ [phone] 

## 2014-08-27 ENCOUNTER — Telehealth: Payer: Self-pay | Admitting: Internal Medicine

## 2014-08-27 DIAGNOSIS — E7800 Pure hypercholesterolemia, unspecified: Secondary | ICD-10-CM

## 2014-08-27 NOTE — Telephone Encounter (Signed)
Refill request for [facility]itor to [facility]. Looks like patient has an cpe scheduled

## 2014-08-28 MED ORDER — ATORVASTATIN CALCIUM 10 MG PO TABS: ORAL_TABLET | ORAL | Status: DC

## 2014-08-28 NOTE — Telephone Encounter (Signed)
This was sent to me. He has CPE for 01/2015, but has not had lipid panel since 2014. Should he schedule med check or come in for labs or is he okay to refill until June?

## 2014-08-30 ENCOUNTER — Other Ambulatory Visit: Payer: Self-pay | Admitting: Family Medicine

## 2014-09-05 ENCOUNTER — Telehealth: Payer: Self-pay | Admitting: Internal Medicine

## 2014-09-05 NOTE — Telephone Encounter (Signed)
Referral submitted and patient notified.

## 2014-09-05 NOTE — Telephone Encounter (Signed)
Pt states that his insurance now requires referrals and he can not set up an appt with Dr. Emeline DarlingGore at [facility] for his sinus issues until he has that referral done. Pt insurance is [employer]. ID #- [id number] and Group # [id number]. Pt is suppose to be sending over his insurance card for us to scan into the computer

## 2014-12-04 ENCOUNTER — Other Ambulatory Visit: Payer: Self-pay | Admitting: Family Medicine

## 2014-12-06 ENCOUNTER — Ambulatory Visit (INDEPENDENT_AMBULATORY_CARE_PROVIDER_SITE_OTHER): Payer: 59 | Admitting: Family Medicine

## 2014-12-06 ENCOUNTER — Encounter: Payer: Self-pay | Admitting: Family Medicine

## 2014-12-06 VITALS — BP 116/74 | HR 76 | Wt 173.6 lb

## 2014-12-06 DIAGNOSIS — J069 Acute upper respiratory infection, unspecified: Secondary | ICD-10-CM

## 2014-12-06 DIAGNOSIS — K629 Disease of anus and rectum, unspecified: Secondary | ICD-10-CM

## 2014-12-06 DIAGNOSIS — K602 Anal fissure, unspecified: Secondary | ICD-10-CM

## 2014-12-06 NOTE — Progress Notes (Signed)
   Subjective:    Patient ID: Kevin Chan, male    DOB: [birth year], 39 y.o.   MRN: [id number]  HPI He is here for 2 issues. He has a 4 day history of difficulty with cough, postnasal drainage, sore throat but no fever, chills, earache. He has been using OTC medications for this. He also has a long history of difficulty with anal itching and apparently internal hemorrhoids. He states that he did have the hemorrhoids banded several years ago. Over the last 3 months he has had increased difficulty with itching. He has noted on a few occasions that after a BM he will no streaking on his stool as well as slight burning sensation.He states his stools are usually formed but loose.   Review of Systems     Objective:   Physical Exam Alert and in no distress. Tympanic membranes and canals are normal. Pharyngeal area is normal. Neck is supple without adenopathy or thyromegaly. Cardiac exam shows a regular sinus rhythm without murmurs or gallops. Lungs are clear to auscultation. Anal exam does show a healing fissure with a sentinel node at the 6:00 position. No blood present. Digital rectal exam not done.       Assessment & Plan:  Rectal fissure  URI, acute Explained that the itching and the symptoms he has points more towards rectal fissure than truly hemorrhoids especially internal hemorrhoids. Explained the fact that internal hemorrhoids are asymptomatic except for bleeding or if they prolapse. The itching speaks towards the fissure. Recommend continuing to use the cortisone cream for the itching. Supportive care for the URI including Delsym and NyQuil at night. Call if continued trouble for recheck.

## 2014-12-06 NOTE — Patient Instructions (Signed)
Use Delsym during the day and NyQuil at night to help with the coughing. If it persists for over a week, set up another appointment. Use the cortisone cream on the anal itching. No evidence of any hemorrhoids.

## 2014-12-12 ENCOUNTER — Telehealth: Payer: Self-pay | Admitting: Family Medicine

## 2014-12-12 MED ORDER — BENZONATATE 100 MG PO CAPS: 200.0000 mg | ORAL_CAPSULE | Freq: Three times a day (TID) | ORAL | Status: DC | PRN

## 2014-12-12 NOTE — Telephone Encounter (Signed)
Pt says since he saw Dr Susann GivensLalonde on 12/06/14 his cough is same-worse. He is coughing all the time. Can Dr Susann GivensLalonde help pt with cough since he saw Dr Susann GivensLalonde recently for this or will he need to be seen again?

## 2014-12-12 NOTE — Telephone Encounter (Signed)
Pt.notified

## 2014-12-12 NOTE — Telephone Encounter (Signed)
Let him know that I called a cough medicine in for him. If he still having trouble next week have him call us.

## 2014-12-16 ENCOUNTER — Encounter: Payer: Self-pay | Admitting: Family Medicine

## 2014-12-16 ENCOUNTER — Ambulatory Visit (INDEPENDENT_AMBULATORY_CARE_PROVIDER_SITE_OTHER): Payer: 59 | Admitting: Family Medicine

## 2014-12-16 VITALS — BP 112/70 | HR 84 | Temp 98.7°F | Ht 72.0 in | Wt 174.0 lb

## 2014-12-16 DIAGNOSIS — R059 Cough, unspecified: Secondary | ICD-10-CM

## 2014-12-16 DIAGNOSIS — R05 Cough: Secondary | ICD-10-CM

## 2014-12-16 NOTE — Patient Instructions (Signed)
  Continue to drink plenty of water. Consider using lozenges/cough drops to help during the day. Restart Mucinex Continue Tessalon.  If no better (or if any worse) towards the end of the week, would treat presumptively with z-pak and short steroid course.  Hopefully these other measures should help minimize the cough while the infection (viral) finishes its course.  Discussed Tussionex, potential for zpak and/or steroids.  Leave a detailed message later this week with what your symptoms are, so we know which is the best course (ie fever, discolored mucus either from sinus rinses, or from coughing, etc).

## 2014-12-16 NOTE — Progress Notes (Signed)
Chief Complaint  Patient presents with  . Cough    saw DrLalonde 12/06/14. Called back last week and tessalon called in, still coughing-not helping. No other symptoms.    Patient presents with complaint of cough.  He was seen on 5/6 for rectal bleeding, but at that time had also had 4 days of cough.  He called back when cough persisted, and Tessalon was called in for him on 5/12.  It helped a little, but he has persistent cough.  Initially cough was more productive, now drying up, mostly dry.  Any phlegm is white or clear.  No blood or discolored mucus.  Denies sinus pressure, fevers, chills.  No shortness of breath, but sometimes notices a rattle at the end of inspiration.  Coughs slightly more during the day than at night.  Doesn't keep him up or wake him up at night.  He took Mucinex regularly for 7 days, and Delsym as well, until he changed to the Kurtenessalon.  Denies heartburn/reflux.  No chest tightness or shortness of breath. +sick contacts--son and wife had been ill with cold/cough.  PMH, PSH, SH reviewed.  Outpatient Encounter Prescriptions as of 12/16/2014  Medication Sig  . atorvastatin (LIPITOR) 10 MG tablet TAKE ONE TABLET BY MOUTH EVERY DAY  . benzonatate (TESSALON PERLES) 100 MG capsule Take 2 capsules (200 mg total) by mouth 3 (three) times daily as needed for cough.  . Coenzyme Q10 300 MG CAPS Take 600 mg by mouth as needed.  . fexofenadine (ALLEGRA) 180 MG tablet Take by mouth.  . fluticasone (FLONASE) 50 MCG/ACT nasal spray Place 2 sprays into the nose daily.    . mometasone (ASMANEX) 220 MCG/INH inhaler Inhale 1 puff into the lungs daily.    . montelukast (SINGULAIR) 10 MG tablet   . Multiple Vitamins-Minerals (MULTIVITAMIN WITH MINERALS) tablet Take 1 tablet by mouth daily.    Marland Kitchen. azelastine (ASTELIN) 137 MCG/SPRAY nasal spray Place 1 spray into the nose 2 (two) times daily. Use in each nostril as directed   . cetirizine (ZYRTEC) 10 MG tablet Take 10 mg by mouth daily.  Marland Kitchen.  EPIPEN 2-PAK 0.3 MG/0.3ML SOAJ injection   . [DISCONTINUED] atorvastatin (LIPITOR) 10 MG tablet Take by mouth.   No facility-administered encounter medications on file as of 12/16/2014.   (Taking allegra, not zyrtec) Allergies  Allergen Reactions  . Cashews [Nuts] Other (See Comments)    thrush   ROS:  Denies fevers, chills, chest pain, palpitations, headaches, dizziness, sore throat, ear pain, nausea, vomiting, diarrhea, heartburn, bleeding, bruising, rash or other complaints except as per HPI.  PHYSICAL EXAM: BP 112/70 mmHg  Pulse 84  Temp(Src) 98.7 F (37.1 C) (Tympanic)  Ht 6' (1.829 m)  Wt 174 lb (78.926 kg)  BMI 23.59 kg/m2  Well developed, pleasant male, with frequent dry, deep cough.  Speaking easily in no distress HEENT: PERRL, EOMI, conjunctiva clear.  TM's and EAC's normal. Nasal mucosa is mildly edematous, some yellow exudate noted in left lateral nares.  OP has some mild erythema posteriorly, but no cobblestoning or purulence drainage.  Sinuses are nontender Neck; No lymphadenopathy or mass Heart: regular rate and rhythm without murmur Lungs:  Intermittent coarse breath sounds, relieved by cough. No wheezes, rales, ronchi.  No cough with forced expiration.  Good air movement throughout. Skin: no rash   ASSESSMENT/PLAN:  Cough   Suspect residual cough from URI, in a patient also with chronic allergies.  Continue to drink plenty of water. Consider using lozenges/cough drops to  help during the day. Restart Mucinex Continue Tessalon.  If no better (or if any worse) towards the end of the week, would treat presumptively with z-pak and short steroid course.  Hopefully these other measures should help minimize the cough while the infection (viral) finishes its course.  Discussed Tussionex, potential for zpak and/or steroids.  Leave a detailed message later this week with what your symptoms are, so we know which is the best course (ie fever, discolored mucus either  from sinus rinses, or from coughing, etc).     Declined tussionex rx today.

## 2014-12-18 ENCOUNTER — Telehealth: Payer: Self-pay | Admitting: *Deleted

## 2014-12-18 MED ORDER — HYDROCOD POLST-CPM POLST ER 10-8 MG/5ML PO SUER: 5.0000 mL | Freq: Every evening | ORAL | Status: DC | PRN

## 2014-12-18 MED ORDER — AZITHROMYCIN 250 MG PO TABS: 250.0000 mg | ORAL_TABLET | ORAL | Status: DC | PRN

## 2014-12-18 MED ORDER — METHYLPREDNISOLONE 4 MG PO TBPK: 4.0000 mg | ORAL_TABLET | ORAL | Status: DC

## 2014-12-18 NOTE — Telephone Encounter (Signed)
Patient called and said he was supposed to call you Thursday and let you know how was doing with the mucinex. He said he is the exact same, if not a "smidge" worse. Monday and Tuesday night were miserable at work and trying to sleep was impossible. Requesting meds be called in.

## 2014-12-18 NOTE — Telephone Encounter (Signed)
Call back and verify that he isn't having fever or discolored mucus. Can try medrol dosepak and Tussionex If fever or worsening symptoms, then add z-pak.

## 2014-12-18 NOTE — Telephone Encounter (Signed)
Patient said he has not had fever but does have worsening symptoms. Called in dospak and zpak-printed rx for tussionex.

## 2015-01-09 ENCOUNTER — Ambulatory Visit (INDEPENDENT_AMBULATORY_CARE_PROVIDER_SITE_OTHER): Payer: 59 | Admitting: Family Medicine

## 2015-01-09 ENCOUNTER — Encounter: Payer: Self-pay | Admitting: Family Medicine

## 2015-01-09 VITALS — BP 116/78 | HR 68 | Ht 72.0 in | Wt 171.8 lb

## 2015-01-09 DIAGNOSIS — E7800 Pure hypercholesterolemia, unspecified: Secondary | ICD-10-CM

## 2015-01-09 DIAGNOSIS — J309 Allergic rhinitis, unspecified: Secondary | ICD-10-CM

## 2015-01-09 DIAGNOSIS — Z83719 Family history of colon polyps, unspecified: Secondary | ICD-10-CM

## 2015-01-09 DIAGNOSIS — Z8371 Family history of colonic polyps: Secondary | ICD-10-CM

## 2015-01-09 DIAGNOSIS — J45909 Unspecified asthma, uncomplicated: Secondary | ICD-10-CM | POA: Insufficient documentation

## 2015-01-09 DIAGNOSIS — R5383 Other fatigue: Secondary | ICD-10-CM | POA: Diagnosis not present

## 2015-01-09 DIAGNOSIS — Z0000 Encounter for general adult medical examination without abnormal findings: Secondary | ICD-10-CM

## 2015-01-09 LAB — CBC WITH DIFFERENTIAL/PLATELET
Basophils Absolute: 0 10*3/uL (ref 0.0–0.1)
Basophils Relative: 0 % (ref 0–1)
Eosinophils Absolute: 0.2 10*3/uL (ref 0.0–0.7)
Eosinophils Relative: 3 % (ref 0–5)
HCT: 43.2 % (ref 39.0–52.0)
Hemoglobin: 14.9 g/dL (ref 13.0–17.0)
Lymphocytes Relative: 29 % (ref 12–46)
Lymphs Abs: 1.6 10*3/uL (ref 0.7–4.0)
MCH: 29.5 pg (ref 26.0–34.0)
MCHC: 34.5 g/dL (ref 30.0–36.0)
MCV: 85.5 fL (ref 78.0–100.0)
MPV: 9.2 fL (ref 8.6–12.4)
Monocytes Absolute: 0.5 10*3/uL (ref 0.1–1.0)
Monocytes Relative: 9 % (ref 3–12)
Neutro Abs: 3.2 10*3/uL (ref 1.7–7.7)
Neutrophils Relative %: 59 % (ref 43–77)
Platelets: 222 10*3/uL (ref 150–400)
RBC: 5.05 MIL/uL (ref 4.22–5.81)
RDW: 13.8 % (ref 11.5–15.5)
WBC: 5.5 10*3/uL (ref 4.0–10.5)

## 2015-01-09 LAB — POCT URINALYSIS DIPSTICK
Bilirubin, UA: NEGATIVE
Blood, UA: NEGATIVE
Glucose, UA: NEGATIVE
Ketones, UA: NEGATIVE
Leukocytes, UA: NEGATIVE
Nitrite, UA: NEGATIVE
Protein, UA: NEGATIVE
Spec Grav, UA: 1.02
Urobilinogen, UA: NEGATIVE
pH, UA: 6.5

## 2015-01-09 LAB — COMPREHENSIVE METABOLIC PANEL
ALT: 26 U/L (ref 0–53)
AST: 20 U/L (ref 0–37)
Albumin: 4.2 g/dL (ref 3.5–5.2)
Alkaline Phosphatase: 66 U/L (ref 39–117)
BUN: 15 mg/dL (ref 6–23)
CO2: 28 mEq/L (ref 19–32)
Calcium: 9.2 mg/dL (ref 8.4–10.5)
Chloride: 103 mEq/L (ref 96–112)
Creat: 0.81 mg/dL (ref 0.50–1.35)
Glucose, Bld: 83 mg/dL (ref 70–99)
Potassium: 4.4 mEq/L (ref 3.5–5.3)
Sodium: 140 mEq/L (ref 135–145)
Total Bilirubin: 0.5 mg/dL (ref 0.2–1.2)
Total Protein: 7 g/dL (ref 6.0–8.3)

## 2015-01-09 LAB — LIPID PANEL
Cholesterol: 175 mg/dL (ref 0–200)
HDL: 52 mg/dL (ref >=40)
LDL Cholesterol: 101 mg/dL — ABNORMAL HIGH (ref 0–99)
Total CHOL/HDL Ratio: 3.4 Ratio
Triglycerides: 110 mg/dL (ref <150)
VLDL: 22 mg/dL (ref 0–40)

## 2015-01-09 LAB — TSH: TSH: 0.891 u[IU]/mL (ref 0.350–4.500)

## 2015-01-09 NOTE — Progress Notes (Signed)
Chief Complaint  Patient presents with  . fasting cpe    fasting cpe. no other concerns. had eyes checked within a year   Kevin Chan is a 39 y.o. male who presents for a complete physical.  He has the following concerns:  He has been off allergy shots for about a year now.  Hasn't noticed much difference, but is doing well with respect to allergies and asthma overall. He is requesting refills of his meds from [place], rather than seeing [name] yearly. He recently got them filled from him.  Hyperlipidemia: taking lipitor daily; requires daily coenzyme q10 to avoid the myalgias.  Following a low cholesterol diet.  He is past due for labs--last checked 07/2013.  He is fasting today.  He donates blood regularly (double red), last 2 months ago.  Immunization History  Administered Date(s) Administered  . Influenza Split 05/30/2009, 05/27/2010, 05/12/2011  . Tdap 01/13/2009  gets flu shots yearly (elsewhere) Last colonoscopy: 2006 with Dr. Vladimir Faster. Internal hemorrhoids  Last PSA: never  Dentist: twice yearly  Ophtho: yearly  Exercise: he had been going to the gym 4x/week, stopped when he was ill and hasn't resumed.  Plays golf once weekly (not in winter).   Past Medical History  Diagnosis Date  . Mitral valve prolapse   . Staph infection   . Hemorrhoid     internal  . Hyperlipidemia     previously had myalgias on Crestor and simvastatin  . Asthma   . Allergic rhinitis, cause unspecified     completed immunotherapy through [facility]    Past Surgical History  Procedure Laterality Date  . Septoplasty  11/2009    turbinate reduction (Dr. Erik Obey)  . Nasal sinus surgery  09/01/10    [facility]  . Vasectomy  08/2013    Dr. Diona Fanti    History   Social History  . Marital Status: Married    Spouse Name: N/A  . Number of Children: 2  . Years of Education: N/A   Occupational History  . financial advisor    Social History Main Topics  . Smoking status: Never  Smoker   . Smokeless tobacco: Never Used  . Alcohol Use: 0.0 oz/week    2-3 Cans of beer per week     Comment: 1-3 drinks per week, heavier with golf trips  . Drug Use: No  . Sexual Activity:    Partners: Female     Comment: vasectomy   Other Topics Concern  . Not on file   Social History Narrative   Lives with wife, son and daughter    Family History  Problem Relation Age of Onset  . Heart disease Father     bypass surgery(51 and 31)  . Hyperlipidemia Father   . Diabetes Father     borderline, improved after weight loss  . Lymphoma Mother   . Colon polyps Mother   . Depression Mother   . Migraines Mother   . Hypertension Mother   . Cancer Mother     nonhodgkin's lymphoma  . Colon polyps Brother 36  . Cancer Maternal Grandmother 8    colon cancer  . Alzheimer's disease Maternal Grandmother     Outpatient Encounter Prescriptions as of 01/09/2015  Medication Sig Note  . atorvastatin (LIPITOR) 10 MG tablet TAKE ONE TABLET BY MOUTH EVERY DAY   . azelastine (ASTELIN) 137 MCG/SPRAY nasal spray Place 1 spray into the nose 2 (two) times daily. Use in each nostril as directed  01/09/2015:  Uses prn, rarely  . cetirizine (ZYRTEC) 10 MG tablet Take 10 mg by mouth daily.   . Coenzyme Q10 300 MG CAPS Take 600 mg by mouth as needed. 01/09/2015: Takes daily (gets myalgias if he doesn't)  . mometasone (ASMANEX) 220 MCG/INH inhaler Inhale 1 puff into the lungs daily.     . montelukast (SINGULAIR) 10 MG tablet  12/16/2014: Received from: External Pharmacy  . Multiple Vitamins-Minerals (MULTIVITAMIN WITH MINERALS) tablet Take 1 tablet by mouth daily.     Marland Kitchen triamcinolone (NASACORT ALLERGY 24HR) 55 MCG/ACT AERO nasal inhaler Place 2 sprays into the nose daily.   Marland Kitchen EPIPEN 2-PAK 0.3 MG/0.3ML SOAJ injection    . [DISCONTINUED] azithromycin (ZITHROMAX) 250 MG tablet Take 1 tablet (250 mg total) by mouth as needed.   . [DISCONTINUED] benzonatate (TESSALON PERLES) 100 MG capsule Take 2 capsules (200  mg total) by mouth 3 (three) times daily as needed for cough.   . [DISCONTINUED] chlorpheniramine-HYDROcodone (TUSSIONEX) 10-8 MG/5ML SUER Take 5 mLs by mouth at bedtime as needed for cough.   . [DISCONTINUED] fexofenadine (ALLEGRA) 180 MG tablet Take by mouth. 12/16/2014: Received from: [facility]  . [DISCONTINUED] fluticasone (FLONASE) 50 MCG/ACT nasal spray Place 2 sprays into the nose daily.     . [DISCONTINUED] methylPREDNISolone (MEDROL DOSEPAK) 4 MG TBPK tablet Take 1 tablet (4 mg total) by mouth as directed.    No facility-administered encounter medications on file as of 01/09/2015.    Allergies  Allergen Reactions  . Cashews [Nuts] Other (See Comments)    thrush   ROS: The patient denies anorexia, fever, weight changes, headaches, vision loss, decreased hearing, ear pain, hoarseness, chest pain, palpitations, dizziness, syncope, dyspnea on exertion, cough, swelling, nausea, vomiting, diarrhea, constipation, abdominal pain, melena, hematochezia, indigestion/heartburn, hematuria, incontinence, erectile dysfunction, nocturia, weakened urine stream, dysuria, genital lesions, joint pains, numbness, tingling, weakness, tremor, suspicious skin lesions, depression, anxiety, abnormal bleeding/bruising, or enlarged lymph nodes Allergies and asthma are well controlled with his current regimen Anal fissure intermittently flares/bleeds. Known internal hemorrhoids Trigger finger right 3rd and 4th fingers, just intermittently. IF worse, plans to have Dr. Lenon Curt treat. Some fatigue--likely related to getting up with kids (and then going to the bathroom)  PHYSICAL EXAM:  BP 116/78 mmHg  Pulse 68  Ht 6' (1.829 m)  Wt 171 lb 12.8 oz (77.928 kg)  BMI 23.30 kg/m2  General Appearance:  Alert, cooperative, no distress, appears stated age   Head:  Normocephalic, without obvious abnormality, atraumatic   Eyes:  PERRL, conjunctiva/corneas clear, EOM's intact, fundi  benign    Ears:  Normal TM's and external ear canals   Nose:  Nares normal, mucosa normal, no sinus tenderness. Some thick white mucus noted on the left  Throat:  Lips, mucosa, and tongue normal; teeth and gums normal   Neck:  Supple, no lymphadenopathy; thyroid: no enlargement/tenderness/nodules; no carotid  bruit or JVD   Back:  Spine nontender, no curvature, ROM normal, no CVA tenderness   Lungs:  Clear to auscultation bilaterally without wheezes, rales or ronchi; respirations unlabored   Chest Wall:  No tenderness or deformity   Heart:  Regular rate and rhythm, S1 and S2 normal, no murmur, rub  or gallop   Breast Exam:  No chest wall tenderness, masses or gynecomastia   Abdomen:  Soft, non-tender, nondistended, normoactive bowel sounds,  no masses, no hepatosplenomegaly   Genitalia:  Normal male external genitalia without lesions. Circumcized. Scrotal piercing. Testicles without masses. No inguinal hernias.  Rectal:  Deferred due to age <40 and lack of symptoms   Extremities:  No clubbing, cyanosis or edema   Pulses:  2+ and symmetric all extremities   Skin:  Skin color, texture, turgor normal, no rashes or lesions   Lymph nodes:  Cervical, supraclavicular, and axillary nodes normal   Neurologic:  CNII-XII intact, normal strength, sensation and gait; reflexes 2+ and symmetric throughout   Psych: Normal mood, affect, hygiene and grooming         ASSESSMENT/PLAN:  Annual physical exam - Plan: Comprehensive metabolic panel, CBC with Differential/Platelet, TSH, Lipid panel, POCT urinalysis dipstick  Pure hypercholesterolemia - past due for labs; tolerating statin along with coenzyme Q10; continue - Plan: Comprehensive metabolic panel, Lipid panel  Asthma, unspecified asthma severity, uncomplicated - controlled on current regimen.  Will call when refills are needed  Allergic rhinitis, unspecified allergic rhinitis type -  controlled on current regimen.  will call when refills are needed  Other fatigue - Plan: Comprehensive metabolic panel, CBC with Differential/Platelet, TSH  Family history of colonic polyps - and CA; 10 years since last colonoscopy.  Call [facility] to schedule - Plan: Ambulatory referral to Gastroenterology   c-met, lipid, CBC, TSH  Recommended at least 30 minutes of aerobic activity at least 5 days/week, weight-bearing exercise at least 2x/week; proper sunscreen use reviewed; healthy diet and alcohol recommendations (less than or equal to 2 drinks/day) reviewed; regular seatbelt use; changing batteries in smoke detectors. Self-testicular exams. Immunization recommendations discussed--UTD. Colonoscopy recommendations reviewed--likely should have repeat--recommend that patient check with [facility] for their recommendations--it has been 10 years, so likely due now (brother had polyps at age 30).  Doesn't need refills currently on his allergy/asthma medications. He will let us know when they are needed.  lipitor won't be needed until August.  F/u 1 year, sooner prn

## 2015-01-09 NOTE — Patient Instructions (Signed)

## 2015-01-10 ENCOUNTER — Encounter: Payer: Self-pay | Admitting: Family Medicine

## 2015-02-26 ENCOUNTER — Telehealth: Payer: Self-pay | Admitting: *Deleted

## 2015-02-26 LAB — HM COLONOSCOPY: HM Colonoscopy: NORMAL

## 2015-02-26 NOTE — Telephone Encounter (Signed)
Okay.  Has he ever taken this before?  If just for sleep, probably could take  tablet (he can take 1/2, and if not strong enough, take the other half, and take the full tablet on the way back, vs taking full tablet, and if too strong, only taking 1/2 on return trip).  If he has ever taken it before, he might recall the dose he took.  Okay to give an extra tablet or two in case he wants to try it before trip to know.

## 2015-02-26 NOTE — Telephone Encounter (Signed)
Patient is going to check and see what strength he had previously and call me back tomorrow.

## 2015-02-26 NOTE — Telephone Encounter (Signed)
Kevin Chan called yesterday and left message on my VM. He is traveling to [place] on 03/06/15. He was wanting to know if you would call him in #2 Valium to help him sleep on his flight- one for the way there and one for the way back please. Also wanted you to know that his colonoscopy is being done today by Dr.Ganem.

## 2015-02-27 MED ORDER — DIAZEPAM 10 MG PO TABS: 10.0000 mg | ORAL_TABLET | Freq: Four times a day (QID) | ORAL | Status: DC | PRN

## 2015-02-27 NOTE — Telephone Encounter (Signed)
Called patient again to see which strength he had in the past.

## 2015-03-10 ENCOUNTER — Encounter: Payer: Self-pay | Admitting: *Deleted

## 2015-03-30 ENCOUNTER — Other Ambulatory Visit: Payer: Self-pay | Admitting: Family Medicine

## 2015-04-09 ENCOUNTER — Telehealth: Payer: Self-pay | Admitting: *Deleted

## 2015-04-09 MED ORDER — MONTELUKAST SODIUM 10 MG PO TABS: 10.0000 mg | ORAL_TABLET | Freq: Every day | ORAL | Status: DC

## 2015-04-09 NOTE — Telephone Encounter (Signed)
This was discussed at his CPE (but refills weren't needed then).  Okay for #90 with 3 refills

## 2015-04-09 NOTE — Telephone Encounter (Signed)
Patient called asking for an rx for a 90 of singulair day be sent to [facility]. States that he is no longer seeing allergist and he talked to you about this at last visit and you agreed.

## 2015-04-09 NOTE — Telephone Encounter (Signed)
rx sent and patient notified.  

## 2015-06-30 ENCOUNTER — Telehealth: Payer: Self-pay | Admitting: *Deleted

## 2015-06-30 NOTE — Telephone Encounter (Signed)
Ok for referral?

## 2015-06-30 NOTE — Telephone Encounter (Signed)
Patient seeing Dr.Jaris Merlyn LotKuzma next Tues 07/08/15 for trigger finger-[facility] fax[facility] to [phone], is this okay?

## 2015-08-07 ENCOUNTER — Telehealth: Payer: Self-pay | Admitting: Family Medicine

## 2015-08-07 MED ORDER — BECLOMETHASONE DIPROPIONATE 80 MCG/ACT IN AERS
1.0000 | INHALATION_SPRAY | Freq: Two times a day (BID) | RESPIRATORY_TRACT | Status: DC
Start: 2015-08-07 — End: 2018-03-01

## 2015-08-07 MED ORDER — MONTELUKAST SODIUM 10 MG PO TABS: 10.0000 mg | ORAL_TABLET | Freq: Every day | ORAL | Status: DC

## 2015-08-07 MED ORDER — ATORVASTATIN CALCIUM 10 MG PO TABS: 10.0000 mg | ORAL_TABLET | Freq: Every day | ORAL | Status: DC

## 2015-08-07 NOTE — Telephone Encounter (Signed)
Pt request 90day refills on Lipitor 10mg , Singulair 10mg , Qvar 80mcg (pt says this med was previously maintained by another doctor but Dr Lynelle DoctorKnapp agreed to take over this med) [facility] through his new insurance. Pt brought a copy of his new card to our office on 08/05/15 to be kept on file.

## 2015-08-07 NOTE — Telephone Encounter (Signed)
Sent x 6 months and also called him and scheduled his next CPE for him for 02/19/16.

## 2016-02-10 ENCOUNTER — Other Ambulatory Visit: Payer: Self-pay | Admitting: Family Medicine

## 2016-02-19 ENCOUNTER — Ambulatory Visit (INDEPENDENT_AMBULATORY_CARE_PROVIDER_SITE_OTHER): Payer: BLUE CROSS/BLUE SHIELD | Admitting: Family Medicine

## 2016-02-19 ENCOUNTER — Encounter: Payer: Self-pay | Admitting: Family Medicine

## 2016-02-19 VITALS — BP 120/70 | HR 74 | Resp 16 | Ht 71.75 in | Wt 170.2 lb

## 2016-02-19 DIAGNOSIS — J309 Allergic rhinitis, unspecified: Secondary | ICD-10-CM | POA: Diagnosis not present

## 2016-02-19 DIAGNOSIS — Z0000 Encounter for general adult medical examination without abnormal findings: Secondary | ICD-10-CM

## 2016-02-19 DIAGNOSIS — J4520 Mild intermittent asthma, uncomplicated: Secondary | ICD-10-CM

## 2016-02-19 DIAGNOSIS — E7800 Pure hypercholesterolemia, unspecified: Secondary | ICD-10-CM | POA: Diagnosis not present

## 2016-02-19 DIAGNOSIS — Z23 Encounter for immunization: Secondary | ICD-10-CM

## 2016-02-19 LAB — COMPREHENSIVE METABOLIC PANEL
ALT: 31 U/L (ref 9–46)
AST: 21 U/L (ref 10–40)
Albumin: 4.3 g/dL (ref 3.6–5.1)
Alkaline Phosphatase: 58 U/L (ref 40–115)
BUN: 19 mg/dL (ref 7–25)
CO2: 25 mmol/L (ref 20–31)
Calcium: 9.2 mg/dL (ref 8.6–10.3)
Chloride: 101 mmol/L (ref 98–110)
Creat: 0.83 mg/dL (ref 0.60–1.35)
Glucose, Bld: 80 mg/dL (ref 65–99)
Potassium: 4.2 mmol/L (ref 3.5–5.3)
Sodium: 139 mmol/L (ref 135–146)
Total Bilirubin: 0.8 mg/dL (ref 0.2–1.2)
Total Protein: 6.8 g/dL (ref 6.1–8.1)

## 2016-02-19 LAB — POCT URINALYSIS DIPSTICK
Bilirubin, UA: NEGATIVE
Blood, UA: NEGATIVE
Glucose, UA: NEGATIVE
Ketones, UA: NEGATIVE
Leukocytes, UA: NEGATIVE
Nitrite, UA: NEGATIVE
Protein, UA: NEGATIVE
Spec Grav, UA: 1.025
Urobilinogen, UA: NEGATIVE
pH, UA: 6.5

## 2016-02-19 LAB — LIPID PANEL
Cholesterol: 195 mg/dL (ref 125–200)
HDL: 61 mg/dL (ref >=40)
LDL Cholesterol: 109 mg/dL (ref <130)
Total CHOL/HDL Ratio: 3.2 Ratio (ref <=5.0)
Triglycerides: 126 mg/dL (ref <150)
VLDL: 25 mg/dL (ref <30)

## 2016-02-19 MED ORDER — MONTELUKAST SODIUM 10 MG PO TABS: 10.0000 mg | ORAL_TABLET | Freq: Every day | ORAL | Status: DC

## 2016-02-19 MED ORDER — ATORVASTATIN CALCIUM 10 MG PO TABS: 10.0000 mg | ORAL_TABLET | Freq: Every day | ORAL | Status: DC

## 2016-02-19 NOTE — Progress Notes (Signed)
Chief Complaint  Patient presents with  . Annual Exam    fasting. need prescription refills- please send to [facility]- Liptior and Singulair.    Kevin Chan is a 40 y.o. male who presents for a complete physical.  He has no concerns.  Hyperlipidemia: taking lipitor daily; requires daily coenzyme q10 to avoid the myalgias. Following a low cholesterol diet.   He donates blood regularly (double red), last in April.  Allergies/asthma: no longer getting allergy shots.  Doing well on his current regimen.  He no longer sees Dr. Irena Cords.  Has been well overall.  Did telemedicine once or twice in the last year and a half for acute illnesses.   Immunization History  Administered Date(s) Administered  . Influenza Split 05/30/2009, 05/27/2010, 05/12/2011  . Influenza-Unspecified 04/21/2015  . Tdap 01/13/2009   Last colonoscopy: 01/2015 with Dr. Evette Cristal; repeat recommended in 5 years Last PSA: never  Dentist: twice yearly  Ophtho: yearly  Exercise: he had been going to the gym 3-4x/week. Yoga once a week. Plays golf once weekly.   Past Medical History  Diagnosis Date  . Mitral valve prolapse   . Staph infection   . Hemorrhoid     internal  . Hyperlipidemia     previously had myalgias on Crestor and simvastatin  . Asthma   . Allergic rhinitis, cause unspecified     completed immunotherapy through [facility]    Past Surgical History  Procedure Laterality Date  . Septoplasty  11/2009    turbinate reduction (Dr. Lazarus Salines)  . Nasal sinus surgery  09/01/10    [facility]  . Vasectomy  08/2013    Dr. Retta Diones    Social History   Social History  . Marital Status: Married    Spouse Name: N/A  . Number of Children: 2  . Years of Education: N/A   Occupational History  . financial advisor    Social History Main Topics  . Smoking status: Never Smoker   . Smokeless tobacco: Never Used  . Alcohol Use: 0.0 oz/week    2-3 Cans of beer per week     Comment: 1-3 drinks per week,  heavier with golf trips  . Drug Use: No  . Sexual Activity:    Partners: Female     Comment: vasectomy   Other Topics Concern  . Not on file   Social History Narrative   Lives with wife, son and daughter    Family History  Problem Relation Age of Onset  . Heart disease Father     bypass surgery(51 and 61)  . Hyperlipidemia Father   . Diabetes Father     borderline, improved after weight loss  . Lymphoma Mother   . Colon polyps Mother   . Depression Mother   . Migraines Mother   . Hypertension Mother   . Cancer Mother     nonhodgkin's lymphoma  . Colon polyps Brother 16  . Cancer Maternal Grandmother 23    colon cancer  . Alzheimer's disease Maternal Grandmother     Outpatient Encounter Prescriptions as of 02/19/2016  Medication Sig Note  . atorvastatin (LIPITOR) 10 MG tablet Take 1 tablet (10 mg total) by mouth daily.   . beclomethasone (QVAR) 80 MCG/ACT inhaler Inhale 1 puff into the lungs 2 (two) times daily. 02/19/2016: Using just once daily  . cetirizine (ZYRTEC) 10 MG tablet Take 10 mg by mouth daily.   . Coenzyme Q10 300 MG CAPS Take 600 mg by mouth as needed. 01/09/2015:  Takes daily (gets myalgias if he doesn't)  . fluticasone (FLONASE) 50 MCG/ACT nasal spray Place 2 sprays into both nostrils daily.   . montelukast (SINGULAIR) 10 MG tablet Take 1 tablet (10 mg total) by mouth at bedtime.   . Multiple Vitamins-Minerals (MULTIVITAMIN WITH MINERALS) tablet Take 1 tablet by mouth daily.     . [DISCONTINUED] EPIPEN 2-PAK 0.3 MG/0.3ML SOAJ injection    . [DISCONTINUED] triamcinolone (NASACORT ALLERGY 24HR) 55 MCG/ACT AERO nasal inhaler Place 2 sprays into the nose daily.   Marland Kitchen azelastine (ASTELIN) 137 MCG/SPRAY nasal spray Place 1 spray into the nose 2 (two) times daily. Reported on 02/19/2016 01/09/2015: Uses prn, rarely  . [DISCONTINUED] diazepam (VALIUM) 10 MG tablet Take 1 tablet (10 mg total) by mouth every 6 (six) hours as needed for anxiety. (Patient not taking: Reported  on 02/19/2016)   . [DISCONTINUED] mometasone (ASMANEX) 220 MCG/INH inhaler Inhale 1 puff into the lungs daily. Reported on 02/19/2016    No facility-administered encounter medications on file as of 02/19/2016.    Allergies  Allergen Reactions  . Cashews [Nuts] Other (See Comments)    thrush    ROS: The patient denies anorexia, fever, weight changes, headaches, vision loss, decreased hearing, ear pain, hoarseness, chest pain, palpitations, dizziness, syncope, dyspnea on exertion, cough, swelling, nausea, vomiting, diarrhea, constipation, abdominal pain, melena, hematochezia, indigestion/heartburn, hematuria, incontinence, erectile dysfunction, nocturia, weakened urine stream, dysuria, genital lesions, joint pains, numbness, tingling, weakness, tremor, suspicious skin lesions, depression, anxiety, abnormal bleeding/bruising, or enlarged lymph nodes Trigger fingers right 3rd and 4th, s/p injections x 2; likely will need surgery. Allergies and asthma are well controlled with his current regimen No rectal bleeding recently (h/o anal fissure and hemorrhoids). Some fatigue--related to his wife's snoring  PHYSICAL EXAM:  BP 120/70 mmHg  Pulse 74  Resp 16  Ht 5' 11.75" (1.822 m)  Wt 170 lb 3.2 oz (77.202 kg)  BMI 23.26 kg/m2  General Appearance:  Alert, cooperative, no distress, appears stated age   Head:  Normocephalic, without obvious abnormality, atraumatic   Eyes:  PERRL, conjunctiva/corneas clear, EOM's intact, fundi  benign   Ears:  Normal TM's and external ear canals   Nose:  Nares normal, mucosa normal, no sinus tenderness.  Throat:  Lips, mucosa, and tongue normal; teeth and gums normal   Neck:  Supple, no lymphadenopathy; thyroid: no enlargement/tenderness/nodules; no carotid  bruit or JVD   Back:  Spine nontender, no curvature, ROM normal, no CVA tenderness   Lungs:  Clear to auscultation bilaterally without wheezes, rales or ronchi; respirations  unlabored   Chest Wall:  No tenderness or deformity   Heart:  Regular rate and rhythm, S1 and S2 normal, no murmur, rub  or gallop   Breast Exam:  No chest wall tenderness, masses or gynecomastia   Abdomen:  Soft, non-tender, nondistended, normoactive bowel sounds,  no masses, no hepatosplenomegaly   Genitalia:  Normal male external genitalia without lesions. Circumcized. Scrotal piercing. Testicles without masses. No inguinal hernias.   Rectal:  Deferred due to age <40 and lack of symptoms   Extremities:  No clubbing, cyanosis or edema   Pulses:  2+ and symmetric all extremities   Skin:  Skin color, texture, turgor normal, no rashes or lesions   Lymph nodes:  Cervical, supraclavicular, and axillary nodes normal   Neurologic:  CNII-XII intact, normal strength, sensation and gait; reflexes 2+ and symmetric throughout   Psych: Normal mood, affect, hygiene and grooming  ASSESSMENT/PLAN:  Annual physical exam - Plan: Urinalysis Dipstick, Visual acuity screening, Lipid panel, Comprehensive metabolic panel  Pure hypercholesterolemia - due for lipids. continue lipitor, low cholesterol diet - Plan: Lipid panel, Comprehensive metabolic panel, atorvastatin (LIPITOR) 10 MG tablet  Asthma, mild intermittent, uncomplicated - controlled with singulair and once daily Qvar--advised to increase to BID with illness/flare - Plan: montelukast (SINGULAIR) 10 MG tablet, Pneumococcal polysaccharide vaccine 23-valent greater than or equal to 2yo subcutaneous/IM, Spirometry with graph  Allergic rhinitis, unspecified allergic rhinitis type - controlled with flonase, zyrtec, singulair - Plan: montelukast (SINGULAIR) 10 MG tablet   Pneumovax (due to asthma) Spirometry today.  Discussed valium for plane travel (has taken in the past, thinks 5mg , not always sleepy). May increase up to 10mg , no mixing with  alcohol.  Recommended at least 30 minutes of aerobic activity at least 5 days/week, weight-bearing exercise at least 2x/week; proper sunscreen use reviewed; healthy diet and alcohol recommendations (less than or equal to 2 drinks/day) reviewed; regular seatbelt use; changing batteries in smoke detectors. Self-testicular exams. Immunization recommendations discussed--pneumovax today; continue flu shots yearly. Colonoscopy recommendations reviewed--UTD; due again 2021  F/u 1 year, sooner prn

## 2016-02-19 NOTE — Patient Instructions (Signed)

## 2016-03-22 ENCOUNTER — Encounter: Payer: Self-pay | Admitting: Family Medicine

## 2016-03-22 ENCOUNTER — Ambulatory Visit (INDEPENDENT_AMBULATORY_CARE_PROVIDER_SITE_OTHER): Payer: BLUE CROSS/BLUE SHIELD | Admitting: Family Medicine

## 2016-03-22 VITALS — BP 102/62 | HR 68 | Temp 97.4°F | Ht 71.75 in | Wt 169.0 lb

## 2016-03-22 DIAGNOSIS — J0100 Acute maxillary sinusitis, unspecified: Secondary | ICD-10-CM

## 2016-03-22 MED ORDER — AZITHROMYCIN 250 MG PO TABS: ORAL_TABLET | ORAL | 0 refills | Status: DC

## 2016-03-22 NOTE — Patient Instructions (Signed)
Treat with z-pak for sinus infection Call after 5 days if no improvement for change in antibiotics. Call on day 10 if not completely better (for refill)  Continue the guaifenesin (mucinex) round the clock (2400mg  daily). Continue decongestants as needed

## 2016-03-22 NOTE — Progress Notes (Signed)
Chief Complaint  Patient presents with  . Facial Pain    and sinus pressure x 8 days. Mucus is yellow in color. Is coughing and is productive. No fevers. No tastebuds.    He woke up 8 days ago with nasal congestion, sore throat. The drainage started the next day, runny nose and PND.  It moved down into his chest, coughing up more phlegm.  Not getting worse, but not getting better over the last few days.  Mucus from nose is very thick and yellow. Phlegm is yellow in the morning, clearer throughout the day.  No fever, chills. +sick contacts (son has been sick--fever, congestion).  Doing sinus rinses twice a day (chronically)--getting very thick yellow-green drainage each time, not lightening or improving.  Last prescription for sinusitis was for Augmentin (may have been from a telemed visit, not here); denies any side effects or lack of benefit from any ABX used in the past.  PMH, PSH SH reviewed  Outpatient Encounter Prescriptions as of 03/22/2016  Medication Sig Note  . atorvastatin (LIPITOR) 10 MG tablet Take 1 tablet (10 mg total) by mouth daily.   Marland Kitchen. azelastine (ASTELIN) 137 MCG/SPRAY nasal spray Place 1 spray into the nose 2 (two) times daily. Reported on 02/19/2016 01/09/2015: Uses prn, rarely  . beclomethasone (QVAR) 80 MCG/ACT inhaler Inhale 1 puff into the lungs 2 (two) times daily. 02/19/2016: Using just once daily  . cetirizine (ZYRTEC) 10 MG tablet Take 10 mg by mouth daily.   . Coenzyme Q10 300 MG CAPS Take 600 mg by mouth as needed. 01/09/2015: Takes daily (gets myalgias if he doesn't)  . fluticasone (FLONASE) 50 MCG/ACT nasal spray Place 2 sprays into both nostrils daily.   . GuaiFENesin (MUCINEX PO) Take 600 mg by mouth every 4 (four) hours.   . montelukast (SINGULAIR) 10 MG tablet Take 1 tablet (10 mg total) by mouth at bedtime.   . Multiple Vitamins-Minerals (MULTIVITAMIN WITH MINERALS) tablet Take 1 tablet by mouth daily.     . pseudoephedrine (SUDAFED) 30 MG tablet Take 30 mg by  mouth every 4 (four) hours as needed for congestion.    No facility-administered encounter medications on file as of 03/22/2016.    Allergies  Allergen Reactions  . Cashews [Nuts] Other (See Comments)    thrush   ROS: no fever, chills, headaches, dizziness, chest pain, shortness of breath  No nausea, vomiting, diarrhea.  +ears plugging/popping, no pain.  No rashes, bleeding, bruising. See HPI.  PHYSICAL EXAM:  BP 102/62 (BP Location: Left Arm, Patient Position: Sitting, Cuff Size: Normal)   Pulse 68   Temp 97.4 F (36.3 C) (Tympanic)   Ht 5' 11.75" (1.822 m)   Wt 169 lb (76.7 kg)   BMI 23.08 kg/m   Well developed, pleasant, well-appearing male in no distress Mucus in tissue after blowing his nose--thick yellow-green mucus HEENT: PERRL, EOMI, conjunctiva and sclera are clear.  TM's and EAC's normal.  Nasal mucosa is mild-mod edematous, no erythema Thick yellow mucus in nares Mildly tender at bilateral maxillary sinuses OP is clear Neck: no lymphadenopathy, thyromegaly or mass Heart: regular rate and rhythm without murmur Lungs: clear bilaterally Extremities: no edema Psych: normal mood, affect, hygiene and grooming Neuro: alert and oriented, cranial nerves intact, normal strength, gait   ASSESSMENT/PLAN:  Acute maxillary sinusitis, recurrence not specified - Plan: azithromycin (ZITHROMAX) 250 MG tablet  Advised to continue mucinex, sudafed prn, continue sinus rinses.   Treat with z-pak. Call after 5 days if no improvement  for change in antibiotics. Call on day 10 if not completely better (for refill)

## 2016-03-29 ENCOUNTER — Telehealth: Payer: Self-pay | Admitting: *Deleted

## 2016-03-29 MED ORDER — AMOXICILLIN-POT CLAVULANATE 875-125 MG PO TABS: 1.0000 | ORAL_TABLET | Freq: Two times a day (BID) | ORAL | 0 refills | Status: DC

## 2016-03-29 NOTE — Telephone Encounter (Signed)
Patient called and stated that he finished zpak Friday and he is worse. More sinus pain and pressure and lots of drainage-said you told him to call if worse.

## 2016-03-29 NOTE — Telephone Encounter (Signed)
Patient advised.

## 2016-03-29 NOTE — Telephone Encounter (Signed)
Change to augmentin. rx sent. Please advise pt

## 2016-04-01 ENCOUNTER — Telehealth: Payer: Self-pay | Admitting: *Deleted

## 2016-04-01 MED ORDER — HYDROCODONE-HOMATROPINE 5-1.5 MG/5ML PO SYRP: 5.0000 mL | ORAL_SOLUTION | Freq: Three times a day (TID) | ORAL | 0 refills | Status: DC | PRN

## 2016-04-01 NOTE — Telephone Encounter (Signed)
Advise him that it takes a good 5 days for the antibiotics to show significant improvement for sinus infection.  He has seen SOME improvement in just a few days, so be patient. Continue the Augmentin, as well as mucinex, and sinus rinses. Continue the sudafed, delsym prn. If he needs a stronger cough med, okay for hycodan syrup, 5cc q8hr prn, but mainly to be used at night, since causes sedation and shouldn't drive while taking #[id number]#[id number].  Okay to print rx for me to sign, and he will need to come by to pick up the written prescription (if he wants it).

## 2016-04-01 NOTE — Telephone Encounter (Signed)
Printed rx  

## 2016-04-01 NOTE — Telephone Encounter (Signed)
Patient called-he started the augmentin Monday and is concerned that he is not doing much better, feels like he is only about 20% better. Still coughing a lot. Tried some of his tessalon perles that he had and they really didn't help. He has been taking sudafed and delsym too. Mucus is still discolored-he is concerned because of the long weekend.

## 2016-05-27 ENCOUNTER — Telehealth: Payer: Self-pay | Admitting: *Deleted

## 2016-05-27 DIAGNOSIS — E7800 Pure hypercholesterolemia, unspecified: Secondary | ICD-10-CM

## 2016-05-27 MED ORDER — MONTELUKAST SODIUM 10 MG PO TABS: 10.0000 mg | ORAL_TABLET | Freq: Every day | ORAL | 2 refills | Status: DC

## 2016-05-27 MED ORDER — ATORVASTATIN CALCIUM 20 MG PO TABS: 10.0000 mg | ORAL_TABLET | Freq: Every day | ORAL | 1 refills | Status: DC

## 2016-05-27 NOTE — Telephone Encounter (Signed)
Patient called and is switching insurance-asked if you could send new 90 day rx's to [facility]. Asking for singulair and atorvastatin (he is on 10mg  but is asking for 20mg ) so he can break in half to save money.

## 2016-05-27 NOTE — Telephone Encounter (Signed)
done

## 2016-09-10 ENCOUNTER — Ambulatory Visit (INDEPENDENT_AMBULATORY_CARE_PROVIDER_SITE_OTHER): Payer: PRIVATE HEALTH INSURANCE | Admitting: Medical

## 2016-09-10 ENCOUNTER — Encounter: Payer: Self-pay | Admitting: Medical

## 2016-09-10 VITALS — BP 98/60 | HR 81 | Temp 98.4°F | Wt 172.8 lb

## 2016-09-10 DIAGNOSIS — R059 Cough, unspecified: Secondary | ICD-10-CM

## 2016-09-10 DIAGNOSIS — R0602 Shortness of breath: Secondary | ICD-10-CM

## 2016-09-10 DIAGNOSIS — J45909 Unspecified asthma, uncomplicated: Secondary | ICD-10-CM | POA: Diagnosis not present

## 2016-09-10 DIAGNOSIS — R05 Cough: Secondary | ICD-10-CM

## 2016-09-10 DIAGNOSIS — R6889 Other general symptoms and signs: Secondary | ICD-10-CM

## 2016-09-10 MED ORDER — AZITHROMYCIN 250 MG PO TABS: ORAL_TABLET | ORAL | 0 refills | Status: DC

## 2016-09-10 MED ORDER — PREDNISONE 10 MG PO TABS: ORAL_TABLET | ORAL | 0 refills | Status: DC

## 2016-09-10 MED ORDER — HYDROCODONE-HOMATROPINE 5-1.5 MG/5ML PO SYRP: 5.0000 mL | ORAL_SOLUTION | Freq: Three times a day (TID) | ORAL | 0 refills | Status: DC | PRN

## 2016-09-10 NOTE — Progress Notes (Signed)
Subjective:  Kevin Chan is a 41 y.o. male who presents for illness.  He reports that his whole family has had the flu.   His wife and 40yo are much better, however, he is having a hard time getting over flu.   He reports feeling run down, fatigued, lots of cough, a little SOB, some productive cough.  Mild body aches and chills.  No fever.  No NVD.  He has asthma, uses Qvar daily, and recently albuterol prn.  Patient is not a smoker.  No other aggravating or relieving factors.  No other c/o.  The following portions of the patient's history were reviewed and updated as appropriate: allergies, current medications, past family history, past medical history, past social history, past surgical history and problem list.  ROS as in subjective  Past Medical History:  Diagnosis Date  . Allergic rhinitis, cause unspecified    completed immunotherapy through [place]  . Asthma   . Hemorrhoid    internal  . Hyperlipidemia    previously had myalgias on Crestor and simvastatin  . Mitral valve prolapse   . Staph infection   . Trigger finger    right 3rd and 4th fingers; s/p injections (Dr. Merlyn LotKuzma)     Objective: BP 98/60   Pulse 81   Temp 98.4 F (36.9 C)   Wt 172 lb 12.8 oz (78.4 kg)   SpO2 98%   BMI 23.60 kg/m   General appearance: Alert, WD/WN, no distress,mildly ill appearing                             Skin: warm, no rash, no diaphoresis                           Head: no sinus tenderness                            Eyes: conjunctiva normal, corneas clear, PERRLA                            Ears: flat TMs, external ear canals normal                          Nose: septum midline, turbinates swollen, with erythema and clear discharge             Mouth/throat: MMM, tongue normal, mild pharyngeal erythema                           Neck: supple, no adenopathy, no thyromegaly, nontender                          Heart: RRR, normal S1, S2, no murmurs                         Lungs: +bronchial  breath sounds, dullness left lower field, otherwise no rhonchi, no wheezes, no rales                Extremities: no edema, nontender      Assessment: Encounter Diagnoses  Name Primary?  . Flu-like symptoms Yes  . Cough   . SOB (shortness of breath)   . Mild asthma without complication, unspecified whether persistent  Plan:  Advised CXR but he declines.   Advised that he could possibly have early pneumonia in light of recent flu symptoms and exam findings.  Discussed importance of rest, hydration, c/t Qvar, but increase albuterol to q2-3 times daily.   Begin zpak. Can use Hycodan syrup prn, prednisone if worsening.  Can begin prednisone.  Advised that cough may linger even after the infection is improved.  Discussed symptoms that would prompt urgent recheck.   Call/return prn.      [name] was seen today for coughing, congestion.  Diagnoses and all orders for this visit:  Flu-like symptoms  Cough  SOB (shortness of breath)  Mild asthma without complication, unspecified whether persistent  Other orders -     HYDROcodone-homatropine (HYCODAN) 5-1.5 MG/5ML syrup; Take 5 mLs by mouth every 8 (eight) hours as needed for cough. -     Discontinue: azithromycin (ZITHROMAX) 250 MG tablet; 2 tablets day 1, then 1 tablet days 2-4 -     Discontinue: predniSONE (DELTASONE) 10 MG tablet; 6/5/4/3/2/1 taper -     Discontinue: azithromycin (ZITHROMAX) 250 MG tablet; 2 tablets day 1, then 1 tablet days 2-4 -     Discontinue: predniSONE (DELTASONE) 10 MG tablet; 6/5/4/3/2/1 taper -     azithromycin (ZITHROMAX) 250 MG tablet; 2 tablets day 1, then 1 tablet days 2-4 -     predniSONE (DELTASONE) 10 MG tablet; 6/5/4/3/2/1 taper

## 2016-09-16 ENCOUNTER — Telehealth: Payer: Self-pay | Admitting: *Deleted

## 2016-09-16 NOTE — Telephone Encounter (Signed)
Patient advised.

## 2016-09-16 NOTE — Telephone Encounter (Signed)
Patient called and said he saw Kevin Chan last Friday for pneumonia. Finished zpak yesterday but is still having some trouble breathing. Somewhat better but not completely, not sure how much better he should be at this point. Wasn't sure if maybe you wanted to call in different meds? Maybe have him come in next week so you could take a listen? He said he is open to doing whatever you recommend.

## 2016-09-16 NOTE — Telephone Encounter (Signed)
If he is at least 50% better, that is good.  The medication will continue to work for another 5 days.  Return next week for recheck if not significantly better. If getting worse--recurrent fevers, increasing shortness of breath, pain with breathing, etc, then he shouldn't wait, needs eval right away (ie ER or UC over the weekend).

## 2016-09-20 ENCOUNTER — Encounter: Payer: Self-pay | Admitting: Family Medicine

## 2016-09-20 ENCOUNTER — Ambulatory Visit (INDEPENDENT_AMBULATORY_CARE_PROVIDER_SITE_OTHER): Payer: PRIVATE HEALTH INSURANCE | Admitting: Family Medicine

## 2016-09-20 VITALS — BP 102/60 | HR 84 | Temp 98.7°F | Ht 71.75 in | Wt 174.8 lb

## 2016-09-20 DIAGNOSIS — J189 Pneumonia, unspecified organism: Secondary | ICD-10-CM | POA: Diagnosis not present

## 2016-09-20 DIAGNOSIS — G4700 Insomnia, unspecified: Secondary | ICD-10-CM | POA: Diagnosis not present

## 2016-09-20 MED ORDER — ZOLPIDEM TARTRATE 10 MG PO TABS: 10.0000 mg | ORAL_TABLET | Freq: Every evening | ORAL | 0 refills | Status: DC | PRN

## 2016-09-20 NOTE — Progress Notes (Signed)
Chief Complaint  Patient presents with  . Cough    still has cough and congestion-not feleing like 50% better-bit close. Varies from day to day. Would like for you to take a listent to his chest.    He saw Vincenza HewsShane on 2/9 with flu-like symptoms (and family members with the flu, influenza B). He reports he had gotten the flu despite taking tamiflu, had started getting better, then came to see Vincenza HewsShane when he started feeling worse again--cough, shortness of breath. CXR was recommended at that time, but he declined.  He was prescribed a z-pak for possible early pneumonia, told to increase his albuterol (which he did, didn't notice any help), and a prednisone course, which he finished last week.    He went to work Monday last week, felt great, then felt worse on Tuesday.  Friday breathing was harder.  Used inhaler, didn't seem to help much. He seems to get better than worse again.  Felt worse through the weekend, better today.  Never had any fever. Cough has been nonproductive (rarely some white phlegm).  He is requesting sleep medication for upcoming plane trip ([place]  Tried valium without benefit in the past.  Requesting ambien. He has not previously taken this.  PMH, PSH, SH reviewed  Outpatient Encounter Prescriptions as of 09/20/2016  Medication Sig Note  . atorvastatin (LIPITOR) 20 MG tablet Take 0.5 tablets (10 mg total) by mouth daily.   . beclomethasone (QVAR) 80 MCG/ACT inhaler Inhale 1 puff into the lungs 2 (two) times daily. 02/19/2016: Using just once daily  . cetirizine (ZYRTEC) 10 MG tablet Take 10 mg by mouth daily.   . Coenzyme Q10 300 MG CAPS Take 600 mg by mouth as needed. 01/09/2015: Takes daily (gets myalgias if he doesn't)  . fluticasone (FLONASE) 50 MCG/ACT nasal spray Place 2 sprays into both nostrils daily.   . montelukast (SINGULAIR) 10 MG tablet Take 1 tablet (10 mg total) by mouth at bedtime.   . Multiple Vitamins-Minerals (MULTIVITAMIN WITH MINERALS) tablet Take 1 tablet  by mouth daily.     Marland Kitchen. azelastine (ASTELIN) 137 MCG/SPRAY nasal spray Place 1 spray into the nose 2 (two) times daily. Reported on 02/19/2016 01/09/2015: Uses prn, rarely  . HYDROcodone-homatropine (HYCODAN) 5-1.5 MG/5ML syrup Take 5 mLs by mouth every 8 (eight) hours as needed for cough. (Patient not taking: Reported on 09/20/2016) 09/20/2016: Used the first few nights so he could sleep  . zolpidem (AMBIEN) 10 MG tablet Take 1 tablet (10 mg total) by mouth at bedtime as needed for sleep.   . [DISCONTINUED] azithromycin (ZITHROMAX) 250 MG tablet 2 tablets day 1, then 1 tablet days 2-4   . [DISCONTINUED] predniSONE (DELTASONE) 10 MG tablet 6/5/4/3/2/1 taper    No facility-administered encounter medications on file as of 09/20/2016.    Remus Loffler(ambien rx'd today, not prior to visit).  Allergies  Allergen Reactions  . Cashews [Nuts] Other (See Comments)    thrush   ROS:  No fever, chills.  He has some mild sinus pressure--thinks some of his cough is likely due to PND, getting caught in his throat.  He currently denies any wheezing or shortness of breath.  No nausea, vomiting, diarrhea, myalgias, bleeding, bruising, rash  PHYSICAL EXAM:  BP 102/60 (BP Location: Left Arm, Patient Position: Sitting, Cuff Size: Normal)   Pulse 84   Temp 98.7 F (37.1 C) (Tympanic)   Ht 5' 11.75" (1.822 m)   Wt 174 lb 12.8 oz (79.3 kg)   SpO2 98%  BMI 23.87 kg/m   Well appearing, talkative male in no distress Occasional dry cough during visit. HEENT: PERRL, EOMI, conjunctiva and sclera are clear.  Nasal mucosa is fairly normal--no purulence. Sinuses nontender. OP is clear Neck: no lymphadenopathy or mass Heart: regular rate and rhythm, no murmur Lungs clear bilaterally.  Good air movement, no wheezes, rales, ronchi Psych: normal mood, affect hygiene and grooming Neuro: alert and oriented, cranial nerves intact, normall gait.   ASSESSMENT/PLAN:   Pneumonia, unspecified organism - clinically improving;  complication of the flu.  Continue supportive management; mucinex, treatment of allergies which may contribute to cough  Insomnia, unspecified type - discussed risks/side effects of ambien--to try at home prior to use on trip. For long plan flight. - Plan: zolpidem (AMBIEN) 10 MG tablet

## 2016-09-20 NOTE — Patient Instructions (Signed)
Your pneumonia clinically has resolved--lungs were clear today. Use Mucinex as needed--you may have some persistent cough related to the pneumonia, or from your sinuses/postnasal.  Return if you have increasing shortness of breath, fever, chest pain, or other new symptoms

## 2017-02-22 NOTE — Progress Notes (Signed)
Chief Complaint  Patient presents with  . cpe    fasting cpe, no other concerns.    Kevin Chan is a 41 y.o. male who presents for a complete physical.  He has the following concerns:  Wife is concerned that his joints "crack" . Hands get stiff after yardwork.  He has some triggering of the fingers first thing in the morning, better later in the day (right hand).  He can still "make them pop".  Denies pain.  He has had injections for trigger fingers on the right in the past.  Some days are worse than others.  (Dr. Izora Ribas, Dr. Merlyn Lot).  He had pneumonia in February, as complication of the flu.  He hasn't had problems since.  He is going to [place] in August.  Given Ambien to use for the flight.  Tried it once the night before an early flight-- at just 1/2 tablet--no adverse effects.  Hyperlipidemia: taking lipitor daily; requires daily coenzyme q10 to avoid the myalgias.He did go for a bit without the coenzyme Q10 and felt okay. Following a low cholesterol diet.   He donates blood regularly (double red), three times/year, last in April.  Allergies/asthma: no longer getting allergy shots. He no longer sees Dr. Irena Cords.  Has been doing well overall.  Asthma is controlled with singulair and once daily Qvar--he reportedly uses it BID with illness/flare. He admits that he isn't as regular with his inhaler.  He is asking about switching to a less expensive inhaler. He hasn't used a rescue inhaler, but notices a little more tightness/congestion.  He is due for spirometry.  Allergic rhinitis-- controlled with flonase (sporadic off season, regularly in spring/fall), zyrtec (or allegra), singulair daily. He does sinus rinses regularly as well. Uses Astelin only prn, mostly during allergy season.   Immunization History  Administered Date(s) Administered  . Influenza Split 05/30/2009, 05/27/2010, 05/12/2011  . Influenza-Unspecified 04/21/2015  . Pneumococcal Polysaccharide-23 02/19/2016  .  Tdap 01/13/2009   Last colonoscopy: 01/2015 with Dr. Evette Cristal; repeat recommended in 5 years Last PSA: never  Dentist: twice yearly  Ophtho: yearly  Exercise: he had been going to the gym 3-4x/week (30 mins elliptical and weights). Yoga once a week. Plays golf 2x/week.   Past Medical History:  Diagnosis Date  . Allergic rhinitis, cause unspecified    completed immunotherapy through [facility]  . Asthma   . Hemorrhoid    internal  . Hyperlipidemia    previously had myalgias on Crestor and simvastatin  . Mitral valve prolapse   . Staph infection   . Trigger finger    right 3rd and 4th fingers; s/p injections (Dr. Merlyn Lot)    Past Surgical History:  Procedure Laterality Date  . NASAL SINUS SURGERY  09/01/10   [facility]  . SEPTOPLASTY  11/2009   turbinate reduction (Dr. Lazarus Salines)  . VASECTOMY  08/2013   Dr. Retta Diones    Social History   Social History  . Marital status: Married    Spouse name: N/A  . Number of children: 2  . Years of education: N/A   Occupational History  . financial advisor [employer]   Social History Main Topics  . Smoking status: Never Smoker  . Smokeless tobacco: Never Used  . Alcohol use 0.0 oz/week    2 - 3 Cans of beer per week     Comment: 1-3 drinks per week, heavier with golf trips  . Drug use: No  . Sexual activity: Yes    Partners:  Female     Comment: vasectomy   Other Topics Concern  . Not on file   Social History Narrative   Lives with wife, son and daughter    Family History  Problem Relation Age of Onset  . Heart disease Father        bypass surgery(51 and 61)  . Hyperlipidemia Father   . Diabetes Father        borderline, improved after weight loss  . Lymphoma Mother   . Colon polyps Mother   . Depression Mother   . Migraines Mother   . Hypertension Mother   . Cancer Mother        nonhodgkin's lymphoma  . Colon polyps Brother [id number]  . Cancer Maternal Grandmother [id number]       colon cancer  . Alzheimer's disease  Maternal Grandmother     Outpatient Encounter Prescriptions as of 02/23/2017  Medication Sig Note  . atorvastatin (LIPITOR) 20 MG tablet Take 0.5 tablets (10 mg total) by mouth daily.   Marland Kitchen. azelastine (ASTELIN) 137 MCG/SPRAY nasal spray Place 1 spray into the nose 2 (two) times daily. Reported on 02/19/2016 01/09/2015: Uses prn, rarely  . beclomethasone (QVAR) 80 MCG/ACT inhaler Inhale 1 puff into the lungs 2 (two) times daily. 02/19/2016: Using just once daily  . cetirizine (ZYRTEC) 10 MG tablet Take 10 mg by mouth daily.   . Coenzyme Q10 300 MG CAPS Take 600 mg by mouth as needed. 01/09/2015: Takes daily (gets myalgias if he doesn't)  . fluticasone (FLONASE) 50 MCG/ACT nasal spray Place 2 sprays into both nostrils daily.   . montelukast (SINGULAIR) 10 MG tablet Take 1 tablet (10 mg total) by mouth at bedtime.   . Multiple Vitamins-Minerals (MULTIVITAMIN WITH MINERALS) tablet Take 1 tablet by mouth daily.     . [DISCONTINUED] montelukast (SINGULAIR) 10 MG tablet Take 1 tablet (10 mg total) by mouth at bedtime.   . fluticasone (FLOVENT HFA) 110 MCG/ACT inhaler Inhale 1 puff into the lungs 2 (two) times daily.   Marland Kitchen. zolpidem (AMBIEN) 10 MG tablet Take 1 tablet (10 mg total) by mouth at bedtime as needed for sleep.   . [DISCONTINUED] HYDROcodone-homatropine (HYCODAN) 5-1.5 MG/5ML syrup Take 5 mLs by mouth every 8 (eight) hours as needed for cough. (Patient not taking: Reported on 09/20/2016) 09/20/2016: Used the first few nights so he could sleep   No facility-administered encounter medications on file as of 02/23/2017.     Allergies  Allergen Reactions  . Cashews [Nuts] Other (See Comments)    thrush    ROS: The patient denies anorexia, fever, weight changes, headaches, vision loss, decreased hearing, ear pain, hoarseness, chest pain, palpitations, dizziness, syncope, dyspnea on exertion, cough, swelling, nausea, vomiting, diarrhea, constipation, abdominal pain, melena, hematochezia,  indigestion/heartburn, hematuria, incontinence, erectile dysfunction, nocturia, weakened urine stream, dysuria, genital lesions, joint pains, numbness, tingling, weakness, tremor, suspicious skin lesions, depression, anxiety, abnormal bleeding/bruising, or enlarged lymph nodes Trigger fingers right 3rd and 4th, s/p injections x 2, also some triggering of the 5th now. While "cracking"/popping of joints are noted, he isn't having any joint pain (see HPI). Allergies and asthma are controlled with his current regimen Had cyst on back injected by derm--no change. Denies pain   PHYSICAL EXAM:  BP 108/62   Pulse 80   Ht 5' 11.75" (1.822 m)   Wt 169 lb 6.4 oz (76.8 kg)   BMI 23.14 kg/m   Wt Readings from Last 3 Encounters:  02/23/17 169 lb 6.4 oz (76.8  kg)  09/20/16 174 lb 12.8 oz (79.3 kg)  09/10/16 172 lb 12.8 oz (78.4 kg)    General Appearance:  Alert, cooperative, no distress, appears stated age   Head:  Normocephalic, without obvious abnormality, atraumatic   Eyes:  PERRL, conjunctiva/corneas clear, EOM's intact, fundi benign   Ears:  Normal TM's and external ear canals   Nose:  Nares normal, mucosa normal, no sinus tenderness.  Throat:  Lips and tongue normal; teeth and gums normal. Small white spot at right posterior OP, consistent with small area of thrush. Rest of mucosa is normal.  Neck:  Supple, no lymphadenopathy; thyroid: no enlargement/tenderness/nodules; no carotid bruit or JVD   Back:  Spine nontender, no curvature, ROM normal, no CVA tenderness   Lungs:  Clear to auscultation bilaterally without wheezes, rales or ronchi; respirations unlabored   Chest Wall:  No tenderness or deformity   Heart:  Regular rate and rhythm, S1 and S2 normal, no murmur, rub  or gallop   Breast Exam:  No chest wall tenderness, masses or gynecomastia   Abdomen:  Soft, non-tender, nondistended, normoactive bowel sounds,  no masses, no  hepatosplenomegaly   Genitalia:  Normal male external genitalia without lesions. Circumcized. Testicles without masses. No inguinal hernias.   Rectal:  Normal sphincter tone, no masses.  Prostate is not enlarged, no nodules or masses. Guaiac negative stool  Extremities:  No clubbing, cyanosis or edema   Pulses:  2+ and symmetric all extremities   Skin:  Skin color, texture, turgor normal, no rashes or lesions. Sebaceous cyst mid back, without tenderness or drainage.  Lymph nodes:  Cervical, supraclavicular, and axillary nodes normal   Neurologic:  CNII-XII intact, normal strength, sensation and gait; reflexes 2+ and symmetric throughout   Psych:   Normal mood, affect, hygiene and grooming  Normal urine dip   ASSESSMENT/PLAN:  Annual physical exam - Plan: POCT Urinalysis DIP (Proadvantage Device), Lipid panel, Comprehensive metabolic panel  Pure hypercholesterolemia - Plan: Lipid panel, Comprehensive metabolic panel  Mild asthma without complication, unspecified whether persistent - not using inhaled steroid as regularly, and not as well controlled; Try changing to flovent for cost purposes, use daily - Plan: montelukast (SINGULAIR) 10 MG tablet, Spirometry with graph  Allergic rhinitis, unspecified seasonality, unspecified trigger - controlled on current regimen - Plan: montelukast (SINGULAIR) 10 MG tablet  Medication monitoring encounter - Plan: Comprehensive metabolic panel   Spirometry--moderate restriction noted (was mild last year); has been less compliant with his inhaled steroids.  Mild thrush--reminded to rinse well after inhaled steroids. Call for nystatin if not resolving.  Change to Flovent HFA--will let us know if there is a less expensive option.  Done specifically for cost purposes. If truly needing BID, may change to 220 mcg (and try just once daily)   Had 4-5 beers last night at the pool (4-8:30 pm last  night).   Recommended at least 30 minutes of aerobic activity at least 5 days/week, weight-bearing exercise at least 2x/week; proper sunscreen use reviewed; healthy diet and alcohol recommendations (less than or equal to 2 drinks/day) reviewed; regular seatbelt use; changing batteries in smoke detectors. Self-testicular exams. Immunization recommendations discussed--continue flu shots yearly. Colonoscopy recommendations reviewed--UTD; due again 2021  F/u 1 year, sooner prn

## 2017-02-23 ENCOUNTER — Encounter: Payer: Self-pay | Admitting: Family Medicine

## 2017-02-23 ENCOUNTER — Ambulatory Visit (INDEPENDENT_AMBULATORY_CARE_PROVIDER_SITE_OTHER): Payer: PRIVATE HEALTH INSURANCE | Admitting: Family Medicine

## 2017-02-23 VITALS — BP 108/62 | HR 80 | Ht 71.75 in | Wt 169.4 lb

## 2017-02-23 DIAGNOSIS — J45909 Unspecified asthma, uncomplicated: Secondary | ICD-10-CM

## 2017-02-23 DIAGNOSIS — Z0000 Encounter for general adult medical examination without abnormal findings: Secondary | ICD-10-CM

## 2017-02-23 DIAGNOSIS — Z5181 Encounter for therapeutic drug level monitoring: Secondary | ICD-10-CM | POA: Diagnosis not present

## 2017-02-23 DIAGNOSIS — J309 Allergic rhinitis, unspecified: Secondary | ICD-10-CM | POA: Diagnosis not present

## 2017-02-23 DIAGNOSIS — E7800 Pure hypercholesterolemia, unspecified: Secondary | ICD-10-CM

## 2017-02-23 LAB — POCT URINALYSIS DIP (PROADVANTAGE DEVICE)
Bilirubin, UA: NEGATIVE
Blood, UA: NEGATIVE
Glucose, UA: NEGATIVE mg/dL
Ketones, POC UA: NEGATIVE mg/dL
Leukocytes, UA: NEGATIVE
Nitrite, UA: NEGATIVE
Protein Ur, POC: NEGATIVE mg/dL
Specific Gravity, Urine: 1.02
Urobilinogen, Ur: NEGATIVE
pH, UA: 7.5 (ref 5.0–8.0)

## 2017-02-23 LAB — COMPREHENSIVE METABOLIC PANEL
ALT: 22 U/L (ref 9–46)
AST: 17 U/L (ref 10–40)
Albumin: 4.6 g/dL (ref 3.6–5.1)
Alkaline Phosphatase: 66 U/L (ref 40–115)
BUN: 20 mg/dL (ref 7–25)
CO2: 26 mmol/L (ref 20–31)
Calcium: 9.3 mg/dL (ref 8.6–10.3)
Chloride: 101 mmol/L (ref 98–110)
Creat: 0.83 mg/dL (ref 0.60–1.35)
Glucose, Bld: 81 mg/dL (ref 65–99)
Potassium: 4.4 mmol/L (ref 3.5–5.3)
Sodium: 136 mmol/L (ref 135–146)
Total Bilirubin: 0.7 mg/dL (ref 0.2–1.2)
Total Protein: 7.2 g/dL (ref 6.1–8.1)

## 2017-02-23 LAB — LIPID PANEL
Cholesterol: 190 mg/dL (ref <200)
HDL: 63 mg/dL (ref >40)
LDL Cholesterol: 102 mg/dL — ABNORMAL HIGH (ref <100)
Total CHOL/HDL Ratio: 3 Ratio (ref <5.0)
Triglycerides: 127 mg/dL (ref <150)
VLDL: 25 mg/dL (ref <30)

## 2017-02-23 MED ORDER — MONTELUKAST SODIUM 10 MG PO TABS: 10.0000 mg | ORAL_TABLET | Freq: Every day | ORAL | 3 refills | Status: DC

## 2017-02-23 MED ORDER — FLUTICASONE PROPIONATE HFA 110 MCG/ACT IN AERO: 1.0000 | INHALATION_SPRAY | Freq: Two times a day (BID) | RESPIRATORY_TRACT | 12 refills | Status: DC

## 2017-02-23 NOTE — Patient Instructions (Signed)
  HEALTH MAINTENANCE RECOMMENDATIONS:  It is recommended that you get at least 30 minutes of aerobic exercise at least 5 days/week (for weight loss, you may need as much as 60-90 minutes). This can be any activity that gets your heart rate up. This can be divided in 10-15 minute intervals if needed, but try and build up your endurance at least once a week.  Weight bearing exercise is also recommended twice weekly.  Eat a healthy diet with lots of vegetables, fruits and fiber.  "Colorful" foods have a lot of vitamins (ie green vegetables, tomatoes, red peppers, etc).  Limit sweet tea, regular sodas and alcoholic beverages, all of which has a lot of calories and sugar.  Up to 2 alcoholic drinks daily may be beneficial for men (unless trying to lose weight, watch sugars).  Drink a lot of water.  Sunscreen of at least SPF 30 should be used on all sun-exposed parts of the skin when outside between the hours of 10 am and 4 pm (not just when at beach or pool, but even with exercise, golf, tennis, and yard work!)  Use a sunscreen that says "broad spectrum" so it covers both UVA and UVB rays, and make sure to reapply every 1-2 hours.  Remember to change the batteries in your smoke detectors when changing your clock times in the spring and fall.  Use your seat belt every time you are in a car, and please drive safely and not be distracted with cell phones and texting while driving.  Rinse well after using inhaled steroids. If you get throat irritation or worsening of thrust, call us for a prescription for Nystatin. Let us know if there is a less expensive inhaled steroid you prefer (once you see the cost of [facility]).

## 2017-02-24 ENCOUNTER — Encounter: Payer: Self-pay | Admitting: Family Medicine

## 2017-06-21 ENCOUNTER — Other Ambulatory Visit: Payer: Self-pay | Admitting: Family Medicine

## 2017-06-21 DIAGNOSIS — J45909 Unspecified asthma, uncomplicated: Secondary | ICD-10-CM

## 2017-06-21 DIAGNOSIS — E7800 Pure hypercholesterolemia, unspecified: Secondary | ICD-10-CM

## 2017-06-21 DIAGNOSIS — J309 Allergic rhinitis, unspecified: Secondary | ICD-10-CM

## 2017-08-31 ENCOUNTER — Ambulatory Visit: Payer: PRIVATE HEALTH INSURANCE | Admitting: Medical

## 2017-08-31 ENCOUNTER — Encounter: Payer: Self-pay | Admitting: Medical

## 2017-08-31 VITALS — BP 110/74 | HR 82 | Temp 98.2°F | Wt 169.6 lb

## 2017-08-31 DIAGNOSIS — J111 Influenza due to unidentified influenza virus with other respiratory manifestations: Secondary | ICD-10-CM | POA: Diagnosis not present

## 2017-08-31 DIAGNOSIS — R062 Wheezing: Secondary | ICD-10-CM | POA: Diagnosis not present

## 2017-08-31 DIAGNOSIS — R059 Cough, unspecified: Secondary | ICD-10-CM

## 2017-08-31 DIAGNOSIS — R05 Cough: Secondary | ICD-10-CM | POA: Diagnosis not present

## 2017-08-31 MED ORDER — ALBUTEROL SULFATE HFA 108 (90 BASE) MCG/ACT IN AERS: 2.0000 | INHALATION_SPRAY | Freq: Four times a day (QID) | RESPIRATORY_TRACT | 0 refills | Status: DC | PRN

## 2017-08-31 MED ORDER — BENZONATATE 200 MG PO CAPS: 200.0000 mg | ORAL_CAPSULE | Freq: Three times a day (TID) | ORAL | 0 refills | Status: DC | PRN

## 2017-08-31 MED ORDER — PREDNISONE 10 MG PO TABS: ORAL_TABLET | ORAL | 0 refills | Status: DC

## 2017-08-31 NOTE — Progress Notes (Signed)
Subjective: Chief Complaint  Patient presents with  . coughing    coughing, pt had the flu last week    Here for cough.   Daughter had the flu about 10 days ago, then he and the rest of the family got the flu.   He missed all week last week of work due to flu illness.   Whole house was sick.  Towards the end of the week developed a cough.   Has had some dyspnea.   Using mucinex, getting up some productive cough.   Has hx/o asthma.  Getting some rattly cough and wheezing some.   Uses Flovent daily and Singulair daily.  Has used albuterol a few times lately this past week.    Last week had whole list of symptoms including fever, body aches, chills, sweats, headache.   Currently just has cough.   No other aggravating or relieving factors. No other complaint.  Past Medical History:  Diagnosis Date  . Allergic rhinitis, cause unspecified    completed immunotherapy through [place]  . Asthma   . Hemorrhoid    internal  . Hyperlipidemia    previously had myalgias on Crestor and simvastatin  . Mitral valve prolapse   . Staph infection   . Trigger finger    right 3rd and 4th fingers; s/p injections (Dr. Merlyn LotKuzma)   Current Outpatient Medications on File Prior to Visit  Medication Sig Dispense Refill  . atorvastatin (LIPITOR) 20 MG tablet TAKE 1/2 TABLET BY MOUTH DAILY. 90 tablet 0  . azelastine (ASTELIN) 137 MCG/SPRAY nasal spray Place 1 spray into the nose 2 (two) times daily. Reported on 02/19/2016    . cetirizine (ZYRTEC) 10 MG tablet Take 10 mg by mouth daily.    . Coenzyme Q10 300 MG CAPS Take 600 mg by mouth as needed.    . fluticasone (FLONASE) 50 MCG/ACT nasal spray Place 2 sprays into both nostrils daily.    . fluticasone (FLOVENT HFA) 110 MCG/ACT inhaler Inhale 1 puff into the lungs 2 (two) times daily. 1 Inhaler 12  . montelukast (SINGULAIR) 10 MG tablet TAKE 1 TABLET (10 MG TOTAL) BY MOUTH AT BEDTIME. 90 tablet 1  . Multiple Vitamins-Minerals (MULTIVITAMIN WITH MINERALS) tablet Take 1  tablet by mouth daily.      . beclomethasone (QVAR) 80 MCG/ACT inhaler Inhale 1 puff into the lungs 2 (two) times daily. (Patient not taking: Reported on 08/31/2017) 3 Inhaler 1  . zolpidem (AMBIEN) 10 MG tablet Take 1 tablet (10 mg total) by mouth at bedtime as needed for sleep. 10 tablet 0   No current facility-administered medications on file prior to visit.    ROS as in subjective    Objective: BP 110/74   Pulse 82   Temp 98.2 F (36.8 C)   Wt 169 lb 9.6 oz (76.9 kg)   SpO2 98%   BMI 23.16 kg/m   General appearance: alert, no distress, WD/WN HEENT: normocephalic, sclerae anicteric, TMs pearly, nares patent, no discharge or erythema, pharynx normal Oral cavity: MMM, no lesions Neck: supple, shoddy anterior tender nodes, no thyromegaly, no masses Heart: RRR, normal S1, S2, no murmurs Lungs: faint wheezes, few rhonchi, no rales, no dullness    Assessment: Encounter Diagnoses  Name Primary?  . Cough Yes  . Wheezing   . Flu syndrome      Plan: Symptoms suggest asthma flare, mild, coming off flu illness.   No obvious pneumonia. Offered CXR, declines for now.   Medications below, refilled albuterol, but  advised he increase his albuterol use the next 4-5 days.  Rest, hydrate well.  If not seeing some improvement in the next 48 hours, call back.    Kevin Chan was seen today for coughing.  Diagnoses and all orders for this visit:  Cough -     Pulse oximetry (single); Future  Wheezing -     Pulse oximetry (single); Future  Flu syndrome -     Pulse oximetry (single); Future  Other orders -     benzonatate (TESSALON) 200 MG capsule; Take 1 capsule (200 mg total) by mouth 3 (three) times daily as needed for cough. -     predniSONE (DELTASONE) 10 MG tablet; 6/5/4/3/2/1 -     albuterol (PROVENTIL HFA;VENTOLIN HFA) 108 (90 Base) MCG/ACT inhaler; Inhale 2 puffs into the lungs every 6 (six) hours as needed for wheezing or shortness of breath.

## 2017-09-02 ENCOUNTER — Telehealth: Payer: Self-pay | Admitting: Family Medicine

## 2017-09-02 ENCOUNTER — Other Ambulatory Visit: Payer: Self-pay | Admitting: Medical

## 2017-09-02 DIAGNOSIS — R05 Cough: Secondary | ICD-10-CM

## 2017-09-02 DIAGNOSIS — R059 Cough, unspecified: Secondary | ICD-10-CM

## 2017-09-02 MED ORDER — AZITHROMYCIN 250 MG PO TABS: ORAL_TABLET | ORAL | 0 refills | Status: DC

## 2017-09-02 NOTE — Telephone Encounter (Signed)
Pt called and states that he is still coughing and the tessalon pearls are not helping, and the steriods, he feels the exact same,  Was informed to call back today if was not feeling any better pt uses [facility] [phone] - [place], [place] - [address] AT  [facility] OF [address] pt can be reached at [phone]

## 2017-09-02 NOTE — Telephone Encounter (Signed)
Pt was called and notified of this information, pt  Kevin SpanielSaid that the recommendation that you gave him is what he is already doing, he is going to try the z pac and  He said that if he started to have sob then he will go to  [facility] or urgent care

## 2017-09-02 NOTE — Telephone Encounter (Signed)
Make sure he is using the Prednisone, I would recommend Albuterol rescue inhaler 2 puffs every 4 hours, c/t Tessalon Perles for cough, hydrate well.    If worse wheezing or difficulty breathing, consider going for recheck at urgent care or hospital.    If worse productive cough, if fever over 100 at this point, if worse wet rattly cough, I'll send antibiotic in case of secondary infection.  Let me know.  The other option since its late in the day Friday, is to go to [facility] to radiology dept to have a chest xray.  I'll put the xray order in system in the event he goes for xray at hospital.

## 2017-12-17 ENCOUNTER — Other Ambulatory Visit: Payer: Self-pay | Admitting: Family Medicine

## 2017-12-17 DIAGNOSIS — E7800 Pure hypercholesterolemia, unspecified: Secondary | ICD-10-CM

## 2017-12-17 DIAGNOSIS — J45909 Unspecified asthma, uncomplicated: Secondary | ICD-10-CM

## 2017-12-17 DIAGNOSIS — J309 Allergic rhinitis, unspecified: Secondary | ICD-10-CM

## 2018-02-28 NOTE — Progress Notes (Signed)
Chief Complaint  Patient presents with  . Annual Exam    fasting annual exam. No concerns.     Kevin Chan is a 42 y.o. male who presents for a complete physical.  He has the following concerns:  He had cyst removed from his back by Dr. Nevada Crane (after steroid shot wasn't effective in decreasing the size). Required second visit, didn't get it all the first time.  Last year we discussed that his joints "crack", hands get stiff after yardwork.  He has some triggering of the fingers first thing in the morning, better later in the day (right hand).  He can still "make them pop".  Denies pain.  He has had injections for trigger fingers on the right in the past.  Some days are worse than others.  (Dr. Lenon Curt, Dr. Fredna Dow). Unchanged.  Worse after playing golf, yardwork (right 3rd and 5th fingers trigger the following morning).  Prescribed Ambien for flight to [place] last year, didn't actually need to use it.  Tried 1/2 tablet prior to trip and had no problems. Planning for trip to [place] 08/2019  Hyperlipidemia: taking lipitor daily (1/2 tablet of 50m); requires daily coenzyme q10 to avoid the myalgias.He did go for a bit without the coenzyme Q10 and felt okay. Following a low cholesterol diet.  Lab Results  Component Value Date   CHOL 190 02/23/2017   HDL 63 02/23/2017   LDLCALC 102 (H) 02/23/2017   TRIG 127 02/23/2017   CHOLHDL 3.0 02/23/2017    He donates blood regularly (double red), three times/year, scheduled next week.  Allergies/asthma: no longer getting allergy shots. He no longer sees Dr. VHarold Hedge Has been doing well overall.  Asthma is controlled with singulair and inhaled steroid (uses seasonally only, none in the last 2 months).   Last year he was using Qvar once daily, BID only with illness/flare. He was changed to Flovent HFA from Qvar due to cost, was only slightly less (and we had said if truly requiring BID dosing, can try changing to 220 mcg and dose once daily to  help with cost).  He used it in the Spring, just once daily, and it was effective (only BID when ill).  Only uses albuterol with illness.  He is due for spirometry. Last year moderate restriction was noted, and he was somewhat less compliant with inhaled steroids last year. He hasn't taken any Flovent for a couple of months. He was noted to have some mild thrush last year, reminded to rinse well after inhaled steroids. He denies any known problems.   Allergic rhinitis-- controlled with flonase (year-round), zyrtec (or allegra), singulair daily. He does sinus rinses regularly as well. Uses Astelin only prn, mostly during allergy season.   Immunization History  Administered Date(s) Administered  . Influenza Split 05/30/2009, 05/27/2010, 05/12/2011  . Influenza-Unspecified 04/21/2015  . Pneumococcal Polysaccharide-23 02/19/2016  . Tdap 01/13/2009   [place] [place] [facility] [place] [place] with Dr. GPenelope Coop repeat recommended in 5 years Last PSA: never  Dentist: twice yearly  Ophtho: last went 18 months ago Exercise: he had been going to the gym 3-4x/week (30 mins elliptical and weights). Plays golf 2x/week (walks in the nicer/cooler weather).  Past Medical History:  Diagnosis Date  . Allergic rhinitis, cause unspecified    [place] [facility] Asthma   . Hemorrhoid    internal  . Hyperlipidemia    previously had myalgias on Crestor and simvastatin  .  Mitral valve prolapse   . Staph infection   . Trigger finger    right 3rd and 4th fingers; s/p injections (Dr. Fredna Dow)    Past Surgical History:  Procedure Laterality Date  . NASAL SINUS SURGERY  09/01/10   [facility]  . SEPTOPLASTY  11/2009   turbinate reduction (Dr. Erik Obey)  . VASECTOMY  08/2013   Dr. Diona Fanti    Social History   Socioeconomic History  . Marital status: Married    Spouse name: Not on file  . Number of children: 2  . Years of  education: Not on file  . Highest education level: Not on file  Occupational History  . Occupation: Health visitor: Visual merchandiser  Social Needs  . Financial resource strain: Not on file  . Food insecurity:    Worry: Not on file    Inability: Not on file  . Transportation needs:    Medical: Not on file    Non-medical: Not on file  Tobacco Use  . Smoking status: Never Smoker  . Smokeless tobacco: Never Used  Substance and Sexual Activity  . Alcohol use: Yes    Alcohol/week: 1.2 - 1.8 oz    Types: 2 - 3 Cans of beer per week    Comment: 1-3 drinks per week, heavier with golf trips (2/d at beach)  . Drug use: No  . Sexual activity: Yes    Partners: Female    Comment: vasectomy  Lifestyle  . Physical activity:    Days per week: Not on file    Minutes per session: Not on file  . Stress: Not on file  Relationships  . Social connections:    Talks on phone: Not on file    Gets together: Not on file    Attends religious service: Not on file    Active member of club or organization: Not on file    Attends meetings of clubs or organizations: Not on file    Relationship status: Not on file  . Intimate partner violence:    Fear of current or ex partner: Not on file    Emotionally abused: Not on file    Physically abused: Not on file    Forced sexual activity: Not on file  Other Topics Concern  . Not on file  Social History Narrative   Lives with wife, son and daughter    Family History  Problem Relation Age of Onset  . Heart disease Father        bypass surgery(51 and 76)  . Hyperlipidemia Father   . Diabetes Father        borderline, improved after weight loss  . Lymphoma Mother   . Colon polyps Mother   . Depression Mother   . Migraines Mother   . Hypertension Mother   . Cancer Mother        nonhodgkin's lymphoma  . Colon polyps Brother 35  . Cancer Maternal Grandmother 80       colon cancer  . Alzheimer's disease Maternal Grandmother      Outpatient Encounter Medications as of 03/01/2018  Medication Sig Note  . atorvastatin (LIPITOR) 20 MG tablet TAKE 1/2 TABLET BY MOUTH DAILY.   Marland Kitchen Coenzyme Q10 300 MG CAPS Take 600 mg by mouth as needed. 01/09/2015: Takes daily (gets myalgias if he doesn't)  . fexofenadine (ALLEGRA) 180 MG tablet Take 180 mg by mouth daily.   . fluticasone (FLONASE) 50 MCG/ACT nasal spray Place 2 sprays into both  nostrils daily.   . montelukast (SINGULAIR) 10 MG tablet TAKE 1 TABLET (10 MG TOTAL) BY MOUTH AT BEDTIME.   . Multiple Vitamins-Minerals (MULTIVITAMIN WITH MINERALS) tablet Take 1 tablet by mouth daily.     . [DISCONTINUED] beclomethasone (QVAR) 80 MCG/ACT inhaler Inhale 1 puff into the lungs 2 (two) times daily. 02/19/2016: Using just once daily  . albuterol (PROVENTIL HFA;VENTOLIN HFA) 108 (90 Base) MCG/ACT inhaler Inhale 2 puffs into the lungs every 6 (six) hours as needed for wheezing or shortness of breath. (Patient not taking: Reported on 03/01/2018)   . azelastine (ASTELIN) 137 MCG/SPRAY nasal spray Place 1 spray into the nose 2 (two) times daily. Reported on 02/19/2016 01/09/2015: Uses prn, rarely  . fluticasone (FLOVENT HFA) 110 MCG/ACT inhaler Inhale 1 puff into the lungs 2 (two) times daily. (Patient not taking: Reported on 03/01/2018)   . [DISCONTINUED] azithromycin (ZITHROMAX) 250 MG tablet 2 tablets day 1, then 1 tablet days 2-4   . [DISCONTINUED] benzonatate (TESSALON) 200 MG capsule Take 1 capsule (200 mg total) by mouth 3 (three) times daily as needed for cough.   . [DISCONTINUED] cetirizine (ZYRTEC) 10 MG tablet Take 10 mg by mouth daily.   . [DISCONTINUED] predniSONE (DELTASONE) 10 MG tablet 6/5/4/3/2/1   . [DISCONTINUED] zolpidem (AMBIEN) 10 MG tablet Take 1 tablet (10 mg total) by mouth at bedtime as needed for sleep.    No facility-administered encounter medications on file as of 03/01/2018.     Allergies  Allergen Reactions  . Cashews [Nuts] Other (See Comments)    thrush    ROS:  The patient denies anorexia, fever, weight changes, headaches, vision loss, decreased hearing, ear pain, hoarseness, chest pain, palpitations, dizziness, syncope, dyspnea on exertion, cough, swelling, nausea, vomiting, diarrhea, constipation, abdominal pain, melena, hematochezia, indigestion/heartburn, hematuria, incontinence, erectile dysfunction, nocturia, weakened urine stream, dysuria, genital lesions, joint pains, numbness, tingling, weakness, tremor, suspicious skin lesions, depression, anxiety, abnormal bleeding/bruising, or enlarged lymph nodes Trigger fingers right 3rd and 5th, 4th is only mild. While "cracking"/popping of joints are noted, he isn't having any joint pain (see HPI). Allergies and asthma are controlled with his current regimen    PHYSICAL EXAM:  BP 100/60   Pulse 64   Ht 6' (1.829 m)   Wt 167 lb 12.8 oz (76.1 kg)   BMI 22.76 kg/m   Wt Readings from Last 3 Encounters:  03/01/18 167 lb 12.8 oz (76.1 kg)  08/31/17 169 lb 9.6 oz (76.9 kg)  02/23/17 169 lb 6.4 oz (76.8 kg)     General Appearance: Alert, cooperative, no distress, appears stated age   Head:  Normocephalic, without obvious abnormality, atraumatic   Eyes:  PERRL, conjunctiva/corneas clear, EOM's intact, fundi benign   Ears:  Normal TM's and external ear canals   Nose:  Nares normal, mucosa normal, no sinus tenderness.  Throat:  Lips and tongue normal; teeth and gums normal. Small white spot at right posterior OP (same location as noted last year)  Neck:  Supple, no lymphadenopathy; thyroid: no enlargement/ tenderness/nodules; no carotid bruit or JVD   Back:  Spine nontender, no curvature, ROM normal, no CVA tenderness   Lungs:  Clear to auscultation bilaterally without wheezes, rales or ronchi; respirations unlabored   Chest Wall:  No tenderness or deformity   Heart:  Regular rate and rhythm, S1 and S2 normal, no murmur, rub  or gallop   Breast Exam:  No chest  wall tenderness, masses or gynecomastia   Abdomen:  Soft, non-tender, nondistended,  normoactive bowel sounds,  no masses, no hepatosplenomegaly   Genitalia:  Normal male external genitalia without lesions. Circumcized. Testicles without masses. No inguinal hernias.   Rectal:  Normal sphincter tone, no masses.  Prostate is not enlarged, no nodules or masses. Guaiac negative stool  Extremities:  No clubbing, cyanosis or edema   Pulses:  2+ and symmetric all extremities   Skin:  Skin color, texture, turgor normal, no rashes or lesions. Small scar/indentation from cyst removal, midline upper back  Lymph nodes:  Cervical, supraclavicular, and axillary nodes normal   Neurologic:  CNII-XII intact, normal strength, sensation and gait; reflexes 2+ and symmetric throughout   Psych:   Normal mood, affect, hygiene and grooming    ASSESSMENT/PLAN:  Annual physical exam - Plan: Visual acuity screening, Comprehensive metabolic panel, CBC with Differential/Platelet, Lipid panel, POCT Urinalysis DIP (Proadvantage Device)  Pure hypercholesterolemia - Plan: Lipid panel  Mild asthma without complication, unspecified whether persistent - mainly seasonal; no evidence of obstruction on today's spirometry, so okay to remain off inhaled steroids on off-season; cont singulair - Plan: Spirometry with graph  Allergic rhinitis, unspecified seasonality, unspecified trigger - Plan: azelastine (ASTELIN) 0.1 % nasal spray  Medication monitoring encounter - Plan: Comprehensive metabolic panel, Lipid panel  Immunization due - risks/side effects reviewed - Plan: Tdap vaccine greater than or equal to 7yo IM  Thrush, oral - suspected, ?if related to flonase (since not using inhaled steroid currently). Treat with monistat; to ENT for further eval if doesn't resolve - Plan: nystatin (MYCOSTATIN) 100000 UNIT/ML suspension   Spirometry c-met, lipids, CBC Tdap--prefer to  get today instead of waiting until next year.  Trial nystatin for spot in throat (?related to Flonase, since not using Flovent?) If not resolved, to ENT for evaluation.  Can return here for recheck if unable to see himself.   Recommended at least 30 minutes of aerobic activity at least 5 days/week, weight-bearing exercise at least 2x/week; proper sunscreen use reviewed; healthy diet and alcohol recommendations (less than or equal to 2 drinks/day) reviewed; regular seatbelt use; changing batteries in smoke detectors. Self-testicular exams. Immunization recommendations discussed--continue flu shots yearly. TdaP due next June, prefers to get today (almost cut himself with rusty clippers recently). Colonoscopy recommendations reviewed--UTD; due again 2021  F/u 1 year, sooner prn

## 2018-03-01 ENCOUNTER — Encounter: Payer: Self-pay | Admitting: Family Medicine

## 2018-03-01 ENCOUNTER — Ambulatory Visit (INDEPENDENT_AMBULATORY_CARE_PROVIDER_SITE_OTHER): Payer: PRIVATE HEALTH INSURANCE | Admitting: Family Medicine

## 2018-03-01 VITALS — BP 100/60 | HR 64 | Ht 72.0 in | Wt 167.8 lb

## 2018-03-01 DIAGNOSIS — Z0000 Encounter for general adult medical examination without abnormal findings: Secondary | ICD-10-CM | POA: Diagnosis not present

## 2018-03-01 DIAGNOSIS — Z5181 Encounter for therapeutic drug level monitoring: Secondary | ICD-10-CM

## 2018-03-01 DIAGNOSIS — Z23 Encounter for immunization: Secondary | ICD-10-CM

## 2018-03-01 DIAGNOSIS — J309 Allergic rhinitis, unspecified: Secondary | ICD-10-CM | POA: Diagnosis not present

## 2018-03-01 DIAGNOSIS — J45909 Unspecified asthma, uncomplicated: Secondary | ICD-10-CM | POA: Diagnosis not present

## 2018-03-01 DIAGNOSIS — E7800 Pure hypercholesterolemia, unspecified: Secondary | ICD-10-CM

## 2018-03-01 DIAGNOSIS — B370 Candidal stomatitis: Secondary | ICD-10-CM

## 2018-03-01 LAB — POCT URINALYSIS DIP (PROADVANTAGE DEVICE)
Bilirubin, UA: NEGATIVE
Blood, UA: NEGATIVE
Glucose, UA: NEGATIVE mg/dL
Ketones, POC UA: NEGATIVE mg/dL
Leukocytes, UA: NEGATIVE
Nitrite, UA: NEGATIVE
Protein Ur, POC: NEGATIVE mg/dL
Specific Gravity, Urine: 1.02
Urobilinogen, Ur: NEGATIVE
pH, UA: 6 (ref 5.0–8.0)

## 2018-03-01 MED ORDER — AZELASTINE HCL 0.1 % NA SOLN: 1.0000 | Freq: Two times a day (BID) | NASAL | 1 refills | Status: DC

## 2018-03-01 MED ORDER — NYSTATIN 100000 UNIT/ML MT SUSP: 5.0000 mL | Freq: Four times a day (QID) | OROMUCOSAL | 1 refills | Status: DC

## 2018-03-01 NOTE — Patient Instructions (Signed)

## 2018-03-02 ENCOUNTER — Encounter: Payer: Self-pay | Admitting: Family Medicine

## 2018-03-02 LAB — COMPREHENSIVE METABOLIC PANEL
ALT: 21 IU/L (ref 0–44)
AST: 14 IU/L (ref 0–40)
Albumin/Globulin Ratio: 1.8 (ref 1.2–2.2)
Albumin: 4.7 g/dL (ref 3.5–5.5)
Alkaline Phosphatase: 70 IU/L (ref 39–117)
BUN/Creatinine Ratio: 20 (ref 9–20)
BUN: 18 mg/dL (ref 6–24)
Bilirubin Total: 0.7 mg/dL (ref 0.0–1.2)
CO2: 23 mmol/L (ref 20–29)
Calcium: 9.8 mg/dL (ref 8.7–10.2)
Chloride: 100 mmol/L (ref 96–106)
Creatinine, Ser: 0.9 mg/dL (ref 0.76–1.27)
GFR calc Af Amer: 122 mL/min/{1.73_m2} (ref >59)
GFR calc non Af Amer: 106 mL/min/{1.73_m2} (ref >59)
Globulin, Total: 2.6 g/dL (ref 1.5–4.5)
Glucose: 92 mg/dL (ref 65–99)
Potassium: 4.5 mmol/L (ref 3.5–5.2)
Sodium: 140 mmol/L (ref 134–144)
Total Protein: 7.3 g/dL (ref 6.0–8.5)

## 2018-03-02 LAB — CBC WITH DIFFERENTIAL/PLATELET
Basophils Absolute: 0 10*3/uL (ref 0.0–0.2)
Basos: 1 %
EOS (ABSOLUTE): 0.3 10*3/uL (ref 0.0–0.4)
Eos: 5 %
Hematocrit: 46.1 % (ref 37.5–51.0)
Hemoglobin: 15.6 g/dL (ref 13.0–17.7)
Immature Grans (Abs): 0 10*3/uL (ref 0.0–0.1)
Immature Granulocytes: 0 %
Lymphocytes Absolute: 1.4 10*3/uL (ref 0.7–3.1)
Lymphs: 21 %
MCH: 30.6 pg (ref 26.6–33.0)
MCHC: 33.8 g/dL (ref 31.5–35.7)
MCV: 90 fL (ref 79–97)
Monocytes Absolute: 0.6 10*3/uL (ref 0.1–0.9)
Monocytes: 9 %
Neutrophils Absolute: 4.1 10*3/uL (ref 1.4–7.0)
Neutrophils: 64 %
Platelets: 285 10*3/uL (ref 150–450)
RBC: 5.1 x10E6/uL (ref 4.14–5.80)
RDW: 13.3 % (ref 12.3–15.4)
WBC: 6.4 10*3/uL (ref 3.4–10.8)

## 2018-03-02 LAB — LIPID PANEL
Chol/HDL Ratio: 2.8 ratio (ref 0.0–5.0)
Cholesterol, Total: 200 mg/dL — ABNORMAL HIGH (ref 100–199)
HDL: 72 mg/dL (ref >39)
LDL Calculated: 109 mg/dL — ABNORMAL HIGH (ref 0–99)
Triglycerides: 97 mg/dL (ref 0–149)
VLDL Cholesterol Cal: 19 mg/dL (ref 5–40)

## 2018-03-20 ENCOUNTER — Other Ambulatory Visit: Payer: Self-pay | Admitting: Family Medicine

## 2018-03-20 DIAGNOSIS — J309 Allergic rhinitis, unspecified: Secondary | ICD-10-CM

## 2018-03-20 DIAGNOSIS — J45909 Unspecified asthma, uncomplicated: Secondary | ICD-10-CM

## 2018-06-09 ENCOUNTER — Other Ambulatory Visit: Payer: Self-pay | Admitting: Family Medicine

## 2018-06-09 DIAGNOSIS — E7800 Pure hypercholesterolemia, unspecified: Secondary | ICD-10-CM

## 2018-06-16 ENCOUNTER — Other Ambulatory Visit: Payer: Self-pay | Admitting: Family Medicine

## 2018-06-16 NOTE — Telephone Encounter (Signed)
Is this ok to refill?  

## 2018-06-16 NOTE — Telephone Encounter (Signed)
yours

## 2018-06-16 NOTE — Telephone Encounter (Signed)
Thanks--not sure if you are aware that you refilled it for a year before sending it to me (which is fine)

## 2018-10-02 ENCOUNTER — Other Ambulatory Visit: Payer: Self-pay | Admitting: Family Medicine

## 2018-10-02 DIAGNOSIS — E7800 Pure hypercholesterolemia, unspecified: Secondary | ICD-10-CM

## 2019-03-07 ENCOUNTER — Telehealth: Payer: Self-pay | Admitting: *Deleted

## 2019-03-07 DIAGNOSIS — E7800 Pure hypercholesterolemia, unspecified: Secondary | ICD-10-CM

## 2019-03-07 DIAGNOSIS — Z5181 Encounter for therapeutic drug level monitoring: Secondary | ICD-10-CM

## 2019-03-07 DIAGNOSIS — Z0000 Encounter for general adult medical examination without abnormal findings: Secondary | ICD-10-CM

## 2019-03-07 NOTE — Telephone Encounter (Signed)
Called patient to confirm for next Wed at 8:45am and he states that he had called back in July and spoke to someone up front and he was rescheduled for 06/07/2019 @ 2:45pm. He was never scheduled in the system from what I could tell. I was able to schedule him for 05/23/2019, he would like to come in 05/21/2019 @ 8:30am for labs, just need to know what labs he needs please. Thanks.

## 2019-03-07 NOTE — Telephone Encounter (Signed)
Orders entered

## 2019-03-14 ENCOUNTER — Encounter: Payer: PRIVATE HEALTH INSURANCE | Admitting: Family Medicine

## 2019-03-26 ENCOUNTER — Other Ambulatory Visit: Payer: Self-pay | Admitting: Family Medicine

## 2019-03-26 DIAGNOSIS — J45909 Unspecified asthma, uncomplicated: Secondary | ICD-10-CM

## 2019-03-26 DIAGNOSIS — J309 Allergic rhinitis, unspecified: Secondary | ICD-10-CM

## 2019-05-20 NOTE — Patient Instructions (Signed)

## 2019-05-20 NOTE — Progress Notes (Signed)
Chief Complaint  Patient presents with  . Annual Exam    fasting annual exam, no concerns.   . Flu Vaccine    will get at pharmacy as it is less expensive than here.    Kevin Chan is a 43 y.o. male who presents for a complete physical.  He has the following concerns:  Hyperlipidemia: taking lipitor daily (1/2 tablet of 20mg ); requires daily coenzyme q10 to avoid the myalgias.He did go for a bit without the coenzyme Q10 and felt okay.Following a low cholesterol diet. Diet has changed since last year, since wife doing Optivia--less carbs, more protein (including beef, now 2-3x/week, 1 egg/week). Lab Results  Component Value Date   CHOL 200 (H) 03/01/2018   HDL 72 03/01/2018   LDLCALC 109 (H) 03/01/2018   TRIG 97 03/01/2018   CHOLHDL 2.8 03/01/2018   He previously donated blood regularly (double red),three times/year.  Unable to donate this year due to travel in January.  He should be able to start again soon, recently got a letter from February.  Allergies/asthma: no longer getting allergy shots. He no longer sees Dr. [employer]. Has beendoingwell overall.  Asthmaiscontrolled with singulair and inhaled steroid (uses seasonally only, mostly in the Spring, hasn't needed since May).  He was changed to Flovent HFA from Qvar due to cost.  He usually uses it just one daily, seasonally, increases it to BID only if sick. Only needs albuterol with illness, hasn't needed in a while. Last spirometry was 01/2018, showing mild restriction.  Allergic rhinitis--controlled with flonase (year-round), zyrtec(or allegra), and singulairdaily. He does sinus rinses regularly as well. Uses Astelin only prn, mostly during allergy season, not currently needing.  He has some triggering of the fingers first thing in the morning, better later in the day (right hand). Denies pain. He has had injections for trigger fingers on the rightin the past. (Dr. 03/2018, Dr. Izora Ribas). Unchanged.  Worse after  playing golf, yardwork (right 3rd and 5th fingers trigger the following morning).  Only occasionally the 4th finger also triggers. Not bothersome, doesn't desire treatment at this time   Immunization History  Administered Date(s) Administered  . Influenza Split 05/30/2009, 05/27/2010, 05/12/2011  . Influenza, Seasonal, Injecte, Preservative Fre 05/13/2014  . Influenza,inj,Quad PF,6+ Mos 04/21/2015, 05/17/2016, 05/30/2018  . Influenza-Unspecified 04/21/2015  . Pneumococcal Polysaccharide-23 02/19/2016  . Tdap 01/13/2009, 03/01/2018   Gets flu shots at pharmacy [facility]) in [place] Last colonoscopy: 01/2015 with Dr. 03/2015; repeat recommended in 5 years Last PSA: never  Dentist: twice yearly  Ophtho: every 2-3 years Exercise: Walking the dog, walks the golf course, 2x/week, some yoga in his bedroom. Pre-COVID regimen had been: gym 3-4x/week(30 mins elliptical and weights), plus golf.   Past Medical History:  Diagnosis Date  . Allergic rhinitis, cause unspecified    completed immunotherapy through [facility]  . Asthma   . Hemorrhoid    internal  . Hyperlipidemia    previously had myalgias on Crestor and simvastatin  . Mitral valve prolapse   . Staph infection   . Trigger finger    right 3rd and 4th fingers; s/p injections (Dr. Evette Cristal)    Past Surgical History:  Procedure Laterality Date  . NASAL SINUS SURGERY  09/01/10   [facility]  . SEPTOPLASTY  11/2009   turbinate reduction (Dr. 12/2009)  . VASECTOMY  08/2013   Dr. 09/2013    Social History   Socioeconomic History  . Marital status: Married    Spouse name: Not  on file  . Number of children: 2  . Years of education: Not on file  . Highest education level: Not on file  Occupational History  . Occupation: Warehouse managerfinancial advisor    Employer: Therapist, [employer]  Social Needs  . Financial resource strain: Not on file  . Food insecurity    Worry: Not on file    Inability: Not on file  .  Transportation needs    Medical: Not on file    Non-medical: Not on file  Tobacco Use  . Smoking status: Never Smoker  . Smokeless tobacco: Never Used  Substance and Sexual Activity  . Alcohol use: Yes    Alcohol/week: 2.0 - 3.0 standard drinks    Types: 2 - 3 Cans of beer per week    Comment: 1-2 drinks per week, heavier with golf trips (2/d at beach)  . Drug use: No  . Sexual activity: Yes    Partners: Female    Comment: vasectomy  Lifestyle  . Physical activity    Days per week: Not on file    Minutes per session: Not on file  . Stress: Not on file  Relationships  . Social Musicianconnections    Talks on phone: Not on file    Gets together: Not on file    Attends religious service: Not on file    Active member of club or organization: Not on file    Attends meetings of clubs or organizations: Not on file    Relationship status: Not on file  . Intimate partner violence    Fear of current or ex partner: Not on file    Emotionally abused: Not on file    Physically abused: Not on file    Forced sexual activity: Not on file  Other Topics Concern  . Not on file  Social History Narrative   Lives with wife, son and daughter. 1 dog.    Family History  Problem Relation Age of Onset  . Heart disease Father        bypass surgery(51 and 61)  . Hyperlipidemia Father   . Diabetes Father        borderline, improved after weight loss  . Lymphoma Mother   . Colon polyps Mother   . Depression Mother   . Migraines Mother   . Hypertension Mother   . Cancer Mother        nonhodgkin's lymphoma  . Colon polyps Brother [id number]  . Cancer Maternal Grandmother [id number]       colon cancer  . Alzheimer's disease Maternal Grandmother     Outpatient Encounter Medications as of 05/23/2019  Medication Sig Note  . atorvastatin (LIPITOR) 20 MG tablet TAKE 1/2 TABLET BY MOUTH DAILY    . Coenzyme Q10 300 MG CAPS Take 600 mg by mouth as needed. 01/09/2015: Takes daily (gets myalgias if he doesn't)  .  fexofenadine (ALLEGRA) 180 MG tablet Take 180 mg by mouth daily.   . fluticasone (FLONASE) 50 MCG/ACT nasal spray Place 2 sprays into both nostrils daily.   . montelukast (SINGULAIR) 10 MG tablet TAKE ONE TABLET BY MOUTH AT BEDTIME    . Multiple Vitamins-Minerals (MULTIVITAMIN WITH MINERALS) tablet Take 1 tablet by mouth daily.     . Zinc 30 MG CAPS Take 1 capsule by mouth daily.   Marland Kitchen. albuterol (PROVENTIL HFA;VENTOLIN HFA) 108 (90 Base) MCG/ACT inhaler Inhale 2 puffs into the lungs every 6 (six) hours as needed for wheezing or shortness of breath. (Patient not taking: Reported  on 03/01/2018)   . azelastine (ASTELIN) 0.1 % nasal spray Place 1-2 sprays into both nostrils 2 (two) times daily. Reported on 02/19/2016 (Patient not taking: Reported on 05/23/2019)   . FLOVENT HFA 110 MCG/ACT inhaler inhale 1 puff by mouth 2 times daily (Patient not taking: Reported on 05/23/2019)   . [DISCONTINUED] nystatin (MYCOSTATIN) 100000 UNIT/ML suspension Use as directed 5 mLs (500,000 Units total) in the mouth or throat 4 (four) times daily.    No facility-administered encounter medications on file as of 05/23/2019.     Allergies  Allergen Reactions  . Cashews [Nuts] Other (See Comments)    thrush    ROS: The patient denies anorexia, fever, headaches, vision loss, decreased hearing, ear pain, hoarseness, chest pain, palpitations, dizziness, syncope, dyspnea on exertion, cough, swelling, nausea, vomiting, diarrhea, constipation, abdominal pain, melena, hematochezia, indigestion/heartburn, hematuria, incontinence, erectile dysfunction, nocturia, weakened urine stream, dysuria, genital lesions, joint pains, numbness, tingling, weakness, tremor, suspicious skin lesions, depression, anxiety, abnormal bleeding/bruising, or enlarged lymph nodes Trigger fingers right 3rd and 5th, 4th is only mild. Denies joint pains (just notes some cracking/popping, not painful) Allergies and asthma are controlled with his current  regimen Some weight loss noted (since wife's diet changed, eating differently at home, also not going to gym)   PHYSICAL EXAM:  BP 110/70   Pulse 68   Temp (!) 96 F (35.6 C) (Other (Comment))   Ht 6' (1.829 m)   Wt 160 lb 9.6 oz (72.8 kg)   BMI 21.78 kg/m   Wt Readings from Last 3 Encounters:  05/23/19 160 lb 9.6 oz (72.8 kg)  03/01/18 167 lb 12.8 oz (76.1 kg)  08/31/17 169 lb 9.6 oz (76.9 kg)    General Appearance: Alert, cooperative, no distress, appears stated age   Head:  Normocephalic, without obvious abnormality, atraumatic   Eyes:  PERRL, conjunctiva/corneas clear, EOM's intact, fundi benign   Ears:  Normal TM's and external ear canals   Nose:  Not examined, wearing mask due to COVID-19 pandemic  Throat:  Normal (small amount of clear-white phlegm posterior OP in the center; no longer has any white spot as noted last year)  Neck:  Supple, no lymphadenopathy; thyroid: no enlargement/ tenderness/nodules; no carotid bruit or JVD   Back:  Spine nontender, no curvature, ROM normal, no CVA tenderness   Lungs:  Clear to auscultation bilaterally without wheezes, rales or ronchi; respirations unlabored   Chest Wall:  No tenderness or deformity   Heart:  Regular rate and rhythm, S1 and S2 normal, no murmur, rub  or gallop   Breast Exam:  No chest wall tenderness, masses or gynecomastia   Abdomen:  Soft, non-tender, nondistended, normoactive bowel sounds,  no masses, no hepatosplenomegaly   Genitalia:  Normal male external genitalia without lesions. Circumcized. Testicles without masses. No inguinal hernias.   Rectal:  Normal sphincter tone, no masses. Prostate is not enlarged, no nodules or masses. Guaiac negative stool  Extremities:  No clubbing, cyanosis or edema   Pulses:  2+ and symmetric all extremities   Skin:  Skin color, texture, turgor normal, no rashes or lesions.Small scar/indentation from cyst removal,  midline upper back  Lymph nodes:  Cervical, supraclavicular, and axillary nodes normal   Neurologic:  CNII-XII intact, normal strength, sensation and gait; reflexes 2+ and symmetric throughout   Psych: Normal mood, affect, hygiene and grooming   ASSESSMENT/PLAN:  Annual physical exam - Plan: Comprehensive metabolic panel, Lipid panel  Pure hypercholesterolemia - due for recheck, prev well  controlled on 10mg  lipitor - Plan: Lipid panel  Medication monitoring encounter - Plan: Comprehensive metabolic panel, Lipid panel  Mild asthma without complication, unspecified whether persistent - continue current regimen (Flovent seasonally when allergies/asthma flares)  Allergic rhinitis, unspecified seasonality, unspecified trigger - continue flonase, zyrtec, astelin prn   Recommended at least 30 minutes of aerobic activity at least 5 days/week, weight-bearing exercise at least 2x/week; proper sunscreen use reviewed; healthy diet and alcohol recommendations (less than or equal to 2 drinks/day) reviewed; regular seatbelt use; changing batteries in smoke detectors. Self-testicular exams. Immunization recommendations discussed--continue flu shots yearly (will get at pharmacy). Colonoscopy recommendations reviewed--UTD; due again 2021  F/u 1 year, sooner prn

## 2019-05-21 ENCOUNTER — Other Ambulatory Visit: Payer: Self-pay

## 2019-05-23 ENCOUNTER — Other Ambulatory Visit: Payer: Self-pay

## 2019-05-23 ENCOUNTER — Ambulatory Visit (INDEPENDENT_AMBULATORY_CARE_PROVIDER_SITE_OTHER): Payer: Self-pay | Admitting: Family Medicine

## 2019-05-23 ENCOUNTER — Encounter: Payer: Self-pay | Admitting: Family Medicine

## 2019-05-23 VITALS — BP 110/70 | HR 68 | Temp 96.0°F | Ht 72.0 in | Wt 160.6 lb

## 2019-05-23 DIAGNOSIS — Z0000 Encounter for general adult medical examination without abnormal findings: Secondary | ICD-10-CM

## 2019-05-23 DIAGNOSIS — Z5181 Encounter for therapeutic drug level monitoring: Secondary | ICD-10-CM

## 2019-05-23 DIAGNOSIS — J45909 Unspecified asthma, uncomplicated: Secondary | ICD-10-CM

## 2019-05-23 DIAGNOSIS — E7800 Pure hypercholesterolemia, unspecified: Secondary | ICD-10-CM

## 2019-05-23 DIAGNOSIS — J309 Allergic rhinitis, unspecified: Secondary | ICD-10-CM

## 2019-05-24 LAB — COMPREHENSIVE METABOLIC PANEL
ALT: 23 IU/L (ref 0–44)
AST: 19 IU/L (ref 0–40)
Albumin/Globulin Ratio: 2.1 (ref 1.2–2.2)
Albumin: 4.9 g/dL (ref 4.0–5.0)
Alkaline Phosphatase: 73 IU/L (ref 39–117)
BUN/Creatinine Ratio: 22 — ABNORMAL HIGH (ref 9–20)
BUN: 18 mg/dL (ref 6–24)
Bilirubin Total: 0.7 mg/dL (ref 0.0–1.2)
CO2: 26 mmol/L (ref 20–29)
Calcium: 10.1 mg/dL (ref 8.7–10.2)
Chloride: 100 mmol/L (ref 96–106)
Creatinine, Ser: 0.83 mg/dL (ref 0.76–1.27)
GFR calc Af Amer: 125 mL/min/{1.73_m2} (ref >59)
GFR calc non Af Amer: 109 mL/min/{1.73_m2} (ref >59)
Globulin, Total: 2.3 g/dL (ref 1.5–4.5)
Glucose: 87 mg/dL (ref 65–99)
Potassium: 4.9 mmol/L (ref 3.5–5.2)
Sodium: 139 mmol/L (ref 134–144)
Total Protein: 7.2 g/dL (ref 6.0–8.5)

## 2019-05-24 LAB — LIPID PANEL
Chol/HDL Ratio: 3 ratio (ref 0.0–5.0)
Cholesterol, Total: 196 mg/dL (ref 100–199)
HDL: 66 mg/dL (ref >39)
LDL Chol Calc (NIH): 110 mg/dL — ABNORMAL HIGH (ref 0–99)
Triglycerides: 111 mg/dL (ref 0–149)
VLDL Cholesterol Cal: 20 mg/dL (ref 5–40)

## 2019-06-13 ENCOUNTER — Other Ambulatory Visit: Payer: Self-pay | Admitting: Family Medicine

## 2019-06-13 DIAGNOSIS — J45909 Unspecified asthma, uncomplicated: Secondary | ICD-10-CM

## 2019-06-13 DIAGNOSIS — J309 Allergic rhinitis, unspecified: Secondary | ICD-10-CM

## 2019-07-12 ENCOUNTER — Other Ambulatory Visit: Payer: Self-pay

## 2019-07-12 DIAGNOSIS — Z20822 Contact with and (suspected) exposure to covid-19: Secondary | ICD-10-CM

## 2019-07-14 LAB — NOVEL CORONAVIRUS, NAA: SARS-CoV-2, NAA: NOT DETECTED

## 2019-07-26 ENCOUNTER — Ambulatory Visit: Payer: HRSA Program | Attending: Internal Medicine

## 2019-07-26 DIAGNOSIS — Z20828 Contact with and (suspected) exposure to other viral communicable diseases: Secondary | ICD-10-CM | POA: Insufficient documentation

## 2019-07-26 DIAGNOSIS — Z20822 Contact with and (suspected) exposure to covid-19: Secondary | ICD-10-CM

## 2019-07-27 LAB — NOVEL CORONAVIRUS, NAA: SARS-CoV-2, NAA: NOT DETECTED

## 2019-10-30 HISTORY — PX: TRIGGER FINGER RELEASE: SHX641

## 2019-12-18 ENCOUNTER — Other Ambulatory Visit: Payer: Self-pay | Admitting: Family Medicine

## 2019-12-18 DIAGNOSIS — E7800 Pure hypercholesterolemia, unspecified: Secondary | ICD-10-CM

## 2020-01-01 ENCOUNTER — Other Ambulatory Visit: Payer: Self-pay | Admitting: Medical

## 2020-03-17 ENCOUNTER — Ambulatory Visit: Payer: No Typology Code available for payment source | Admitting: Family Medicine

## 2020-03-17 ENCOUNTER — Other Ambulatory Visit: Payer: Self-pay

## 2020-03-17 ENCOUNTER — Ambulatory Visit: Payer: Self-pay | Admitting: Family Medicine

## 2020-03-17 ENCOUNTER — Encounter: Payer: Self-pay | Admitting: Family Medicine

## 2020-03-17 VITALS — BP 100/70 | HR 72 | Ht 72.0 in | Wt 167.2 lb

## 2020-03-17 DIAGNOSIS — K648 Other hemorrhoids: Secondary | ICD-10-CM | POA: Diagnosis not present

## 2020-03-17 DIAGNOSIS — K625 Hemorrhage of anus and rectum: Secondary | ICD-10-CM | POA: Diagnosis not present

## 2020-03-17 DIAGNOSIS — Z8371 Family history of colonic polyps: Secondary | ICD-10-CM | POA: Diagnosis not present

## 2020-03-17 MED ORDER — HYDROCORTISONE ACETATE 25 MG RE SUPP: 25.0000 mg | Freq: Two times a day (BID) | RECTAL | 0 refills | Status: DC | PRN

## 2020-03-17 NOTE — Progress Notes (Signed)
Chief Complaint  Patient presents with  . Hemorrhoids    patient thinks he may have a hemorrhoid. Bright red blood and itching.    H/o hemorrhoid surgery 8-9 years ago (rubber banded). His current symptoms started 2 weeks ago, and feels similar. Sees some BRB on toilet paper only, very rarely in the toilet (usually from a hard stool/fissure). Stools are soft. Had some actual diarrhea 2-3 weeks ago, at the onset, but not since.  He has been using Tucks pads. Has used Preparation H (external cream, not suppositories). Itching is more in the evenings, tried Pinworm medication OTC.  Hasn't helped. Nobody else has itchy bottoms in the home. Gold bond medicated powder temporarily helps.  Stools are soft, my "ass is messy", unsure if wiping too aggressively is making things worse.  He feels like there is a rash.  PMH, PSH, SH reviewed He had normal colonoscopy in 01/2015 with Dr. Evette Cristal.  Due for 5 year follow-up (due to FHx of colon polyps and colon cancer). He has not yet gotten notified  Added most recent surgery--Trigger finger R 3rd and 4th Dr. Melvyn Novas on 10/30/2019   Outpatient Encounter Medications as of 03/17/2020  Medication Sig Note  . atorvastatin (LIPITOR) 20 MG tablet TAKE 1/2 TABLET BY MOUTH EVERY DAY   . cetirizine (ZYRTEC) 10 MG tablet Take 10 mg by mouth daily.   . Coenzyme Q10 300 MG CAPS Take 600 mg by mouth as needed. 01/09/2015: Takes daily (gets myalgias if he doesn't)  . montelukast (SINGULAIR) 10 MG tablet TAKE ONE TABLET BY MOUTH AT BEDTIME    . Multiple Vitamins-Minerals (MULTIVITAMIN WITH MINERALS) tablet Take 1 tablet by mouth daily.     . Zinc 30 MG CAPS Take 1 capsule by mouth daily.   Marland Kitchen albuterol (PROVENTIL HFA;VENTOLIN HFA) 108 (90 Base) MCG/ACT inhaler Inhale 2 puffs into the lungs every 6 (six) hours as needed for wheezing or shortness of breath. (Patient not taking: Reported on 03/01/2018)   . azelastine (ASTELIN) 0.1 % nasal spray Place 1-2 sprays into both  nostrils 2 (two) times daily. Reported on 02/19/2016 (Patient not taking: Reported on 05/23/2019)   . fexofenadine (ALLEGRA) 180 MG tablet Take 180 mg by mouth daily. (Patient not taking: Reported on 03/17/2020)   . FLOVENT HFA 110 MCG/ACT inhaler INHALE ONE PUFF BY MOUTH INTO THE LUNGS TWICE DAILY  (Patient not taking: Reported on 03/17/2020)   . fluticasone (FLONASE) 50 MCG/ACT nasal spray Place 2 sprays into both nostrils daily. (Patient not taking: Reported on 03/17/2020)   . hydrocortisone (ANUSOL-HC) 25 MG suppository Place 1 suppository (25 mg total) rectally 2 (two) times daily as needed for hemorrhoids or anal itching.    No facility-administered encounter medications on file as of 03/17/2020.   (NOT using Anusol HC prior to today's visit)  Allergies  Allergen Reactions  . Cashews [Nuts] Other (See Comments)    thrush   ROS: no fever, chills, nausea, vomiting, decreased appetite, weight loss. No urinary complaints.  No URI symptoms, chest pain or other concerns, except as noted in HPI   PHYSICAL EXAM:  BP 100/70   Pulse 72   Ht 6' (1.829 m)   Wt 167 lb 3.2 oz (75.8 kg)   BMI 22.68 kg/m    Wt Readings from Last 3 Encounters:  03/17/20 167 lb 3.2 oz (75.8 kg)  05/23/19 160 lb 9.6 oz (72.8 kg)  03/01/18 167 lb 12.8 oz (76.1 kg)   Well-appearing, thin male, in no distress, in  good spirits HEENT: conjunctiva and sclera are clear, anicteric, EOMI. Wearing mask Heart: regular rate and rhythm Lungs: clear Abdomen: soft, nontender, normal bowel sounds Rectum: externally appears normal, no rash, erythema or hemorrhoids noted.  There is a non-irritated tag Anoscopy--inflamed internal hemorrhoids anteriorly Trace heme+ stool. No mass. Normal sphincter tone  ASSESSMENT/PLAN:  Internal hemorrhoids - Anusol HC suppositories BID prn. Sitz baths.  - Plan: hydrocortisone (ANUSOL-HC) 25 MG suppository  Family history of colonic polyps - and colon cancer.  He is due for 5 year f/u on  colonoscopy. Reminded to contact GI to schedule  Rectal bleeding - Plan: PR DIAGNOSTIC ANOSCOPY

## 2020-03-17 NOTE — Patient Instructions (Addendum)
Use the suppositories to treat your internal hemorrhoids, twice daily as needed. You as due for another colonoscopy--contact [facility] (Dr. Evette Cristal) to schedule this.

## 2020-04-11 ENCOUNTER — Other Ambulatory Visit: Payer: Self-pay

## 2020-04-11 DIAGNOSIS — Z20822 Contact with and (suspected) exposure to covid-19: Secondary | ICD-10-CM

## 2020-04-14 LAB — NOVEL CORONAVIRUS, NAA: SARS-CoV-2, NAA: NOT DETECTED

## 2020-04-30 ENCOUNTER — Other Ambulatory Visit: Payer: Self-pay

## 2020-04-30 DIAGNOSIS — Z20822 Contact with and (suspected) exposure to covid-19: Secondary | ICD-10-CM

## 2020-05-01 LAB — SARS-COV-2, NAA 2 DAY TAT

## 2020-05-01 LAB — NOVEL CORONAVIRUS, NAA: SARS-CoV-2, NAA: NOT DETECTED

## 2020-06-10 NOTE — Progress Notes (Signed)
Chief Complaint  Patient presents with  . Annual Exam    fasting annual exam (blood in lab). Having some sleep issues.     Kevin Chan is a 44 y.o. male who presents for a complete physical.  He is complaining of sleep issues.  He has trouble "turning his brain off at night, "forever". Especially if he wakes up at 3am, thinks of all the things he needs to do. He was given [place] for travel in the past (still has 1 left from years ago). Doesn't take it, worries about potential side effects. Was once given amitriptylene when prescribed steroids by a telemed doc. Sometimes will wake up with some restless legs, especially after playing golf. Uses Theragun prn, which helps.  He was last seen in August with flare of internal hemorrhoids, noted on anoscopy. Symptoms were bleeding and itching. He was prescribed Anusol HC suppositories. He reports this worked well, still has some left.  He had normal colonoscopy in 01/2015 with Dr. Evette Cristal.  Due for 5 year follow-up (due to FHx of colon polyps and colon cancer).   His insurance doesn't cover this, so is thinking of trying to hold off until he is closer to 46.  Hyperlipidemia: taking lipitor daily (1/2 tablet of 20mg ); requires daily coenzyme q10 to avoid the myalgias.(he didn't tolerate Crestor in the past).Continues on same diet as last year (changed when wife started Optivia)--less carbs, more protein (including beef, now 2-3x/week, 1 egg/week). Lab Results  Component Value Date   CHOL 196 05/23/2019   HDL 66 05/23/2019   LDLCALC 110 (H) 05/23/2019   TRIG 111 05/23/2019   CHOLHDL 3.0 05/23/2019   He donates blood regularly (double red),three times/year, last 03/02/2020.  Allergies/asthma: Previously got allergy shots with Dr. 05/02/2020. Has beendoingwell. Asthmaiscontrolled with singulair daily and inhaled steroid (uses seasonally in spring and fall).  He uses Flovent HFA (previously took Qvar, changed due to cost).  He usually uses it  just once daily, seasonally, rarely increases it to BID only if sick. Only needs albuterol with illness, hasn't needed in a while. Last spirometry was 01/2018, showing mild restriction.  Allergic rhinitis--controlled with flonase (year-round), zyrtec or allegra, and singulairdaily. He does sinus rinses regularly as well. Uses Astelin only prn, mostly during allergy season in the Spring.  Immunization History  Administered Date(s) Administered  . Influenza Split 05/30/2009, 05/27/2010, 05/12/2011  . Influenza, Seasonal, Injecte, Preservative Fre 05/13/2014  . Influenza,inj,Quad PF,6+ Mos 04/21/2015, 05/17/2016, 05/30/2018  . Influenza-Unspecified 04/21/2015, 05/18/2019  . Moderna SARS-COVID-2 Vaccination 10/15/2019, 11/12/2019  . Pneumococcal Polysaccharide-23 02/19/2016  . Tdap 01/13/2009, 03/01/2018  get flu shots at the pharmacy Last colonoscopy: 01/2015 with Dr. 03/2015; repeat recommended in 5 years Last PSA: never  Dentist: twice yearly  Ophtho: every 2 years Exercise: they are renovating his gym, tried Evette Cristal once but hasn't been going. Skiing in the winter and golfs (walks the course half the time). Hasn't been walking or doing yoga.   PMH, PSH, SH and FH were reviewed and updated.  Outpatient Encounter Medications as of 06/11/2020  Medication Sig Note  . atorvastatin (LIPITOR) 10 MG tablet Take 1 tablet (10 mg total) by mouth daily.   . Coenzyme Q10 300 MG CAPS Take 600 mg by mouth as needed. 01/09/2015: Takes daily (gets myalgias if he doesn't)  . fexofenadine (ALLEGRA) 180 MG tablet Take 180 mg by mouth daily.    03/11/2015 FLOVENT HFA 110 MCG/ACT inhaler INHALE ONE PUFF BY MOUTH INTO THE LUNGS  TWICE DAILY    . fluticasone (FLONASE) 50 MCG/ACT nasal spray Place 2 sprays into both nostrils daily.    . montelukast (SINGULAIR) 10 MG tablet Take 1 tablet (10 mg total) by mouth at bedtime.   . Multiple Vitamins-Minerals (MULTIVITAMIN WITH MINERALS) tablet Take 1 tablet by mouth  daily.     . Zinc 30 MG CAPS Take 1 capsule by mouth daily.   . [DISCONTINUED] atorvastatin (LIPITOR) 20 MG tablet TAKE 1/2 TABLET BY MOUTH EVERY DAY   . [DISCONTINUED] montelukast (SINGULAIR) 10 MG tablet TAKE ONE TABLET BY MOUTH AT BEDTIME    . albuterol (PROVENTIL HFA;VENTOLIN HFA) 108 (90 Base) MCG/ACT inhaler Inhale 2 puffs into the lungs every 6 (six) hours as needed for wheezing or shortness of breath. (Patient not taking: Reported on 03/01/2018)   . azelastine (ASTELIN) 0.1 % nasal spray Place 1-2 sprays into both nostrils 2 (two) times daily. Reported on 02/19/2016 (Patient not taking: Reported on 05/23/2019)   . cetirizine (ZYRTEC) 10 MG tablet Take 10 mg by mouth daily. (Patient not taking: Reported on 06/11/2020)   . hydrocortisone (ANUSOL-HC) 25 MG suppository Place 1 suppository (25 mg total) rectally 2 (two) times daily as needed for hemorrhoids or anal itching. (Patient not taking: Reported on 06/11/2020)    No facility-administered encounter medications on file as of 06/11/2020.   Allergies  Allergen Reactions  . Cashews [Nuts] Other (See Comments)    thrush    ROS: The patient denies anorexia, fever, headaches, vision loss, decreased hearing, ear pain, hoarseness, chest pain, palpitations, dizziness, syncope, dyspnea on exertion, cough, swelling, nausea, vomiting, diarrhea, constipation, abdominal pain, melena, hematochezia, indigestion/heartburn, hematuria, incontinence, erectile dysfunction, nocturia, weakened urine stream, dysuria, genital lesions, joint pains, numbness, tingling, weakness, tremor, suspicious skin lesions, depression, anxiety, abnormal bleeding/bruising, or enlarged lymph nodes Denies joint pains. Allergies and asthma are controlled with his current regimen. Hemorrhoids aren't flaring. Insomnia per HPI    PHYSICAL EXAM:  BP 120/80   Pulse 60   Ht 6' (1.829 m)   Wt 167 lb 12.8 oz (76.1 kg)   BMI 22.76 kg/m   Wt Readings from Last 3 Encounters:   06/11/20 167 lb 12.8 oz (76.1 kg)  03/17/20 167 lb 3.2 oz (75.8 kg)  05/23/19 160 lb 9.6 oz (72.8 kg)    General Appearance: Alert, cooperative, no distress, appears stated age   Head:  Normocephalic, without obvious abnormality, atraumatic   Eyes:  PERRL, conjunctiva/corneas clear, EOM's intact, fundi benign   Ears:  Normal TM's and external ear canals   Nose:  Not examined, wearing mask due to COVID-19 pandemic  Throat:  Not examined, wearing mask due to COVID-19 pandemic  Neck:  Supple, no lymphadenopathy; thyroid: no enlargement/ tenderness/nodules; no carotid bruit or JVD   Back:  Spine nontender, no curvature, ROM normal, no CVA tenderness   Lungs:  Clear to auscultation bilaterally without wheezes, rales or ronchi; respirations unlabored   Chest Wall:  No tenderness or deformity   Heart:  Regular rate and rhythm, S1 and S2 normal, no murmur, rub  or gallop   Breast Exam:  No chest wall tenderness, masses or gynecomastia   Abdomen:  Soft, non-tender, nondistended, normoactive bowel sounds,  no masses, no hepatosplenomegaly   Genitalia:  Normal male external genitalia without lesions. Circumcized. Testicles without masses. No inguinal hernias.   Rectal:  Normal sphincter tone, no masses. Prostate is not enlarged, no nodules or masses. Guaiac negative stool  Extremities:  No clubbing, cyanosis or  edema   Pulses:  2+ and symmetric all extremities   Skin:  Skin color, texture, turgor normal, no rashes or lesions.  Lymph nodes:  Cervical, supraclavicular, and axillary nodes normal   Neurologic:  CNII-XII intact, normal strength, sensation and gait; reflexes 2+ and symmetric throughout   Psych: Normal mood, affect, hygiene and grooming   ASSESSMENT/PLAN:  Annual physical exam - Plan: Comprehensive metabolic panel, CBC with Differential/Platelet, Lipid panel  Pure hypercholesterolemia  - cont lowfat diet, lipitor (changed to 10mg  to not have to cut tablets) - Plan: Lipid panel, atorvastatin (LIPITOR) 10 MG tablet  Medication monitoring encounter - Plan: Comprehensive metabolic panel, Lipid panel  Mild asthma without complication, unspecified whether persistent - continue singulair, flovent, albuterol prn - Plan: montelukast (SINGULAIR) 10 MG tablet  Allergic rhinitis, unspecified seasonality, unspecified trigger - controlled on current regimen - Plan: montelukast (SINGULAIR) 10 MG tablet  Insomnia, unspecified type - counseled re: sleep hygiene, relaxation techniques. OTC medications, ambien prn if other measures not working.   Recommended at least 30 minutes of aerobic activity at least 5 days/week, weight-bearing exercise at least 2x/week; proper sunscreen use reviewed; healthy diet and alcohol recommendations (less than or equal to 2 drinks/day) reviewed; regular seatbelt use; changing batteries in smoke detectors.  Immunization recommendations discussed--continue flu shots yearly (will get at pharmacy). Colonoscopy recommendations reviewed--UTD; due now per GI, but prefers to hold off until age 51.  F/u 1 year, sooner prn Spirometry next year

## 2020-06-10 NOTE — Patient Instructions (Addendum)
HEALTH MAINTENANCE RECOMMENDATIONS:  It is recommended that you get at least 30 minutes of aerobic exercise at least 5 days/week (for weight loss, you may need as much as 60-90 minutes). This can be any activity that gets your heart rate up. This can be divided in 10-15 minute intervals if needed, but try and build up your endurance at least once a week.  Weight bearing exercise is also recommended twice weekly.  Eat a healthy diet with lots of vegetables, fruits and fiber.  "Colorful" foods have a lot of vitamins (ie green vegetables, tomatoes, red peppers, etc).  Limit sweet tea, regular sodas and alcoholic beverages, all of which has a lot of calories and sugar.  Up to 2 alcoholic drinks daily may be beneficial for men (unless trying to lose weight, watch sugars).  Drink a lot of water.  Sunscreen of at least SPF 30 should be used on all sun-exposed parts of the skin when outside between the hours of 10 am and 4 pm (not just when at beach or pool, but even with exercise, golf, tennis, and yard work!)  Use a sunscreen that says "broad spectrum" so it covers both UVA and UVB rays, and make sure to reapply every 1-2 hours.  Remember to change the batteries in your smoke detectors when changing your clock times in the spring and fall.  Carbon monoxide detectors are recommended for your home.  Use your seat belt every time you are in a car, and please drive safely and not be distracted with cell phones and texting while driving.  You can try taking benadryl (diphenhydramine) at bedtime if needed to help your sleep. If that isn't effective, you can use the ambien for a couple of nights in a row if you haven't slept well for a week.  We discussed sleep hygiene measures, relaxation techniques to try when you need to shut your brain down.   Insomnia Insomnia is a sleep disorder that makes it difficult to fall asleep or stay asleep. Insomnia can cause fatigue, low energy, difficulty concentrating, mood  swings, and poor performance at work or school. There are three different ways to classify insomnia:  Difficulty falling asleep.  Difficulty staying asleep.  Waking up too early in the morning. Any type of insomnia can be long-term (chronic) or short-term (acute). Both are common. Short-term insomnia usually lasts for three months or less. Chronic insomnia occurs at least three times a week for longer than three months. What are the causes? Insomnia may be caused by another condition, situation, or substance, such as:  Anxiety.  Certain medicines.  Gastroesophageal reflux disease (GERD) or other gastrointestinal conditions.  Asthma or other breathing conditions.  Restless legs syndrome, sleep apnea, or other sleep disorders.  Chronic pain.  Menopause.  Stroke.  Abuse of alcohol, tobacco, or illegal drugs.  Mental health conditions, such as depression.  Caffeine.  Neurological disorders, such as Alzheimer's disease.  An overactive thyroid (hyperthyroidism). Sometimes, the cause of insomnia may not be known. What increases the risk? Risk factors for insomnia include:  Gender. Women are affected more often than men.  Age. Insomnia is more common as you get older.  Stress.  Lack of exercise.  Irregular work schedule or working night shifts.  Traveling between different time zones.  Certain medical and mental health conditions. What are the signs or symptoms? If you have insomnia, the main symptom is having trouble falling asleep or having trouble staying asleep. This may lead to other symptoms, such  as:  Feeling fatigued or having low energy.  Feeling nervous about going to sleep.  Not feeling rested in the morning.  Having trouble concentrating.  Feeling irritable, anxious, or depressed. How is this diagnosed? This condition may be diagnosed based on:  Your symptoms and medical history. Your health care provider may ask about: ? Your sleep  habits. ? Any medical conditions you have. ? Your mental health.  A physical exam. How is this treated? Treatment for insomnia depends on the cause. Treatment may focus on treating an underlying condition that is causing insomnia. Treatment may also include:  Medicines to help you sleep.  Counseling or therapy.  Lifestyle adjustments to help you sleep better. Follow these instructions at home: Eating and drinking   Limit or avoid alcohol, caffeinated beverages, and cigarettes, especially close to bedtime. These can disrupt your sleep.  Do not eat a large meal or eat spicy foods right before bedtime. This can lead to digestive discomfort that can make it hard for you to sleep. Sleep habits   Keep a sleep diary to help you and your health care provider figure out what could be causing your insomnia. Write down: ? When you sleep. ? When you wake up during the night. ? How well you sleep. ? How rested you feel the next day. ? Any side effects of medicines you are taking. ? What you eat and drink.  Make your bedroom a dark, comfortable place where it is easy to fall asleep. ? Put up shades or blackout curtains to block light from outside. ? Use a white noise machine to block noise. ? Keep the temperature cool.  Limit screen use before bedtime. This includes: ? Watching TV. ? Using your smartphone, tablet, or computer.  Stick to a routine that includes going to bed and waking up at the same times every day and night. This can help you fall asleep faster. Consider making a quiet activity, such as reading, part of your nighttime routine.  Try to avoid taking naps during the day so that you sleep better at night.  Get out of bed if you are still awake after 15 minutes of trying to sleep. Keep the lights down, but try reading or doing a quiet activity. When you feel sleepy, go back to bed. General instructions  Take over-the-counter and prescription medicines only as told by your  health care provider.  Exercise regularly, as told by your health care provider. Avoid exercise starting several hours before bedtime.  Use relaxation techniques to manage stress. Ask your health care provider to suggest some techniques that may work well for you. These may include: ? Breathing exercises. ? Routines to release muscle tension. ? Visualizing peaceful scenes.  Make sure that you drive carefully. Avoid driving if you feel very sleepy.  Keep all follow-up visits as told by your health care provider. This is important. Contact a health care provider if:  You are tired throughout the day.  You have trouble in your daily routine due to sleepiness.  You continue to have sleep problems, or your sleep problems get worse. Get help right away if:  You have serious thoughts about hurting yourself or someone else. If you ever feel like you may hurt yourself or others, or have thoughts about taking your own life, get help right away. You can go to your nearest emergency department or call:  Your local emergency services (911 in the [place]  A suicide crisis helpline, such as the [employer]  Suicide Prevention Lifeline at 681-542-8665. This is open 24 hours a day. Summary  Insomnia is a sleep disorder that makes it difficult to fall asleep or stay asleep.  Insomnia can be long-term (chronic) or short-term (acute).  Treatment for insomnia depends on the cause. Treatment may focus on treating an underlying condition that is causing insomnia.  Keep a sleep diary to help you and your health care provider figure out what could be causing your insomnia. This information is not intended to replace advice given to you by your health care provider. Make sure you discuss any questions you have with your health care provider. Document Revised: 07/01/2017 Document Reviewed: 04/28/2017 Elsevier Patient Education  2020 [employer].

## 2020-06-11 ENCOUNTER — Other Ambulatory Visit: Payer: Self-pay

## 2020-06-11 ENCOUNTER — Encounter: Payer: Self-pay | Admitting: Family Medicine

## 2020-06-11 ENCOUNTER — Ambulatory Visit (INDEPENDENT_AMBULATORY_CARE_PROVIDER_SITE_OTHER): Payer: No Typology Code available for payment source | Admitting: Family Medicine

## 2020-06-11 VITALS — BP 120/80 | HR 60 | Ht 72.0 in | Wt 167.8 lb

## 2020-06-11 DIAGNOSIS — Z0000 Encounter for general adult medical examination without abnormal findings: Secondary | ICD-10-CM

## 2020-06-11 DIAGNOSIS — J309 Allergic rhinitis, unspecified: Secondary | ICD-10-CM

## 2020-06-11 DIAGNOSIS — Z5181 Encounter for therapeutic drug level monitoring: Secondary | ICD-10-CM

## 2020-06-11 DIAGNOSIS — J45909 Unspecified asthma, uncomplicated: Secondary | ICD-10-CM | POA: Diagnosis not present

## 2020-06-11 DIAGNOSIS — E7800 Pure hypercholesterolemia, unspecified: Secondary | ICD-10-CM | POA: Diagnosis not present

## 2020-06-11 DIAGNOSIS — G4700 Insomnia, unspecified: Secondary | ICD-10-CM

## 2020-06-11 LAB — LIPID PANEL

## 2020-06-11 MED ORDER — MONTELUKAST SODIUM 10 MG PO TABS: 10.0000 mg | ORAL_TABLET | Freq: Every day | ORAL | 3 refills | Status: DC

## 2020-06-11 MED ORDER — ATORVASTATIN CALCIUM 10 MG PO TABS: 10.0000 mg | ORAL_TABLET | Freq: Every day | ORAL | 3 refills | Status: DC

## 2020-06-12 ENCOUNTER — Encounter: Payer: Self-pay | Admitting: Family Medicine

## 2020-06-12 LAB — COMPREHENSIVE METABOLIC PANEL
ALT: 20 IU/L (ref 0–44)
AST: 16 IU/L (ref 0–40)
Albumin/Globulin Ratio: 2 (ref 1.2–2.2)
Albumin: 4.7 g/dL (ref 4.0–5.0)
Alkaline Phosphatase: 77 IU/L (ref 44–121)
BUN/Creatinine Ratio: 26 — ABNORMAL HIGH (ref 9–20)
BUN: 22 mg/dL (ref 6–24)
Bilirubin Total: 0.5 mg/dL (ref 0.0–1.2)
CO2: 27 mmol/L (ref 20–29)
Calcium: 9.6 mg/dL (ref 8.7–10.2)
Chloride: 104 mmol/L (ref 96–106)
Creatinine, Ser: 0.84 mg/dL (ref 0.76–1.27)
GFR calc Af Amer: 124 mL/min/{1.73_m2} (ref >59)
GFR calc non Af Amer: 107 mL/min/{1.73_m2} (ref >59)
Globulin, Total: 2.4 g/dL (ref 1.5–4.5)
Glucose: 92 mg/dL (ref 65–99)
Potassium: 4.8 mmol/L (ref 3.5–5.2)
Sodium: 143 mmol/L (ref 134–144)
Total Protein: 7.1 g/dL (ref 6.0–8.5)

## 2020-06-12 LAB — CBC WITH DIFFERENTIAL/PLATELET
Basophils Absolute: 0 10*3/uL (ref 0.0–0.2)
Basos: 1 %
EOS (ABSOLUTE): 0.3 10*3/uL (ref 0.0–0.4)
Eos: 5 %
Hematocrit: 44.2 % (ref 37.5–51.0)
Hemoglobin: 15.2 g/dL (ref 13.0–17.7)
Immature Grans (Abs): 0 10*3/uL (ref 0.0–0.1)
Immature Granulocytes: 0 %
Lymphocytes Absolute: 1.7 10*3/uL (ref 0.7–3.1)
Lymphs: 27 %
MCH: 29.8 pg (ref 26.6–33.0)
MCHC: 34.4 g/dL (ref 31.5–35.7)
MCV: 87 fL (ref 79–97)
Monocytes Absolute: 0.5 10*3/uL (ref 0.1–0.9)
Monocytes: 8 %
Neutrophils Absolute: 3.7 10*3/uL (ref 1.4–7.0)
Neutrophils: 59 %
Platelets: 274 10*3/uL (ref 150–450)
RBC: 5.1 x10E6/uL (ref 4.14–5.80)
RDW: 13.1 % (ref 11.6–15.4)
WBC: 6.1 10*3/uL (ref 3.4–10.8)

## 2020-06-12 LAB — LIPID PANEL
Chol/HDL Ratio: 3.4 ratio (ref 0.0–5.0)
Cholesterol, Total: 198 mg/dL (ref 100–199)
HDL: 58 mg/dL (ref >39)
LDL Chol Calc (NIH): 122 mg/dL — ABNORMAL HIGH (ref 0–99)
Triglycerides: 102 mg/dL (ref 0–149)
VLDL Cholesterol Cal: 18 mg/dL (ref 5–40)

## 2021-02-05 ENCOUNTER — Other Ambulatory Visit: Payer: Self-pay

## 2021-02-05 ENCOUNTER — Ambulatory Visit (INDEPENDENT_AMBULATORY_CARE_PROVIDER_SITE_OTHER): Payer: No Typology Code available for payment source | Admitting: Family Medicine

## 2021-02-05 ENCOUNTER — Encounter: Payer: Self-pay | Admitting: Family Medicine

## 2021-02-05 VITALS — BP 120/82 | HR 98 | Temp 98.3°F | Wt 163.0 lb

## 2021-02-05 DIAGNOSIS — N5089 Other specified disorders of the male genital organs: Secondary | ICD-10-CM | POA: Diagnosis not present

## 2021-02-05 NOTE — Patient Instructions (Signed)
We are sending you for ultrasound of the scrotum, which should evaluate both areas of concern.  I'm not worried about the left side. The right side is new since your last physical. I believe there is a separation and that this isn't truly a testicular lump, but imaging will verify that.

## 2021-02-05 NOTE — Progress Notes (Signed)
Chief Complaint  Patient presents with   other    Lump on both testicles lump on lt. Testicle been there along time feels like may have changed a little now one on rt. Testicle is new been there a few weeks.    Has had an area on the top of the left testicle that has been there for some time, unchanged, nontender.  He has recently noted a slight ache, described as a "heaviness".  He recently noted an area at the inferior portion of the right testicle.  This is nontender, but has him concerned, as it is new.  He tried to get in with urologist, but appointment isn't until August (had to be seen at [facility], so part of [facility], due to his cost/coverage).     PMH, PSH, SH reviewed He is s/p vasectomy   Outpatient Encounter Medications as of 02/05/2021  Medication Sig Note   atorvastatin (LIPITOR) 10 MG tablet Take 1 tablet (10 mg total) by mouth daily.    Coenzyme Q10 300 MG CAPS Take 600 mg by mouth as needed. 01/09/2015: Takes daily (gets myalgias if he doesn't)   fexofenadine (ALLEGRA) 180 MG tablet Take 180 mg by mouth daily.     FLOVENT HFA 110 MCG/ACT inhaler INHALE ONE PUFF BY MOUTH INTO THE LUNGS TWICE DAILY     fluticasone (FLONASE) 50 MCG/ACT nasal spray Place 2 sprays into both nostrils daily.     montelukast (SINGULAIR) 10 MG tablet Take 1 tablet (10 mg total) by mouth at bedtime.    Multiple Vitamins-Minerals (MULTIVITAMIN WITH MINERALS) tablet Take 1 tablet by mouth daily.      Zinc 30 MG CAPS Take 1 capsule by mouth daily.    albuterol (PROVENTIL HFA;VENTOLIN HFA) 108 (90 Base) MCG/ACT inhaler Inhale 2 puffs into the lungs every 6 (six) hours as needed for wheezing or shortness of breath. (Patient not taking: No sig reported) 02/05/2021: prn   azelastine (ASTELIN) 0.1 % nasal spray Place 1-2 sprays into both nostrils 2 (two) times daily. Reported on 02/19/2016 (Patient not taking: No sig reported)    cetirizine (ZYRTEC) 10 MG tablet Take 10 mg by mouth daily. (Patient not taking:  Reported on 06/11/2020)    hydrocortisone (ANUSOL-HC) 25 MG suppository Place 1 suppository (25 mg total) rectally 2 (two) times daily as needed for hemorrhoids or anal itching. (Patient not taking: Reported on 06/11/2020)    No facility-administered encounter medications on file as of 02/05/2021.   Allergies  Allergen Reactions   Cashews [Nuts] Other (See Comments)    thrush    ROS: no fever, chills, URI symptoms, GI complaints or GU complaints, except new scrotal lumps as per HPI.  No other complaints/concerns.    PHYSICAL EXAM:  BP 120/82   Pulse 98   Temp 98.3 F (36.8 C)   Wt 163 lb (73.9 kg)   BMI 22.11 kg/m   Pleasant, well-appearing male in no distress GU: Left testicle is normal.  Area he notices is in the superior portion of the scrotum above the testicle, nontender R testicle--there is a nodule at the inferiormost portion--hard to discern if this is separate from the testicle or a nodule on the testicle (I believe there may be a separation).  Remaining scrotum is normal. No inguinal lymphadenopathy No hernia   ASSESSMENT/PLAN:  Testicular mass - Plan: US Scrotum   Pt to seek more immediate evaluation if severe pain or swelling. Otherwise, will check [place] to evaluate for testicular mass, in which case  he has appt to f/u with urologist. Suspect that lump is scrotal, not truly a nodule/mass on the testicle, but need [place] to confirm.

## 2021-02-09 ENCOUNTER — Other Ambulatory Visit: Payer: Self-pay | Admitting: *Deleted

## 2021-02-09 ENCOUNTER — Encounter: Payer: Self-pay | Admitting: Family Medicine

## 2021-02-09 DIAGNOSIS — N5089 Other specified disorders of the male genital organs: Secondary | ICD-10-CM

## 2021-02-12 ENCOUNTER — Other Ambulatory Visit: Payer: Self-pay

## 2021-02-12 ENCOUNTER — Ambulatory Visit (HOSPITAL_COMMUNITY)
Admission: RE | Admit: 2021-02-12 | Discharge: 2021-02-12 | Disposition: A | Discharge status: Home / Self Care | Payer: No Typology Code available for payment source | Source: Ambulatory Visit | Attending: Family Medicine | Admitting: Family Medicine

## 2021-02-12 DIAGNOSIS — N5089 Other specified disorders of the male genital organs: Secondary | ICD-10-CM | POA: Diagnosis not present

## 2021-02-16 ENCOUNTER — Encounter: Payer: Self-pay | Admitting: Family Medicine

## 2021-03-17 ENCOUNTER — Ambulatory Visit: Payer: Self-pay | Admitting: Urology

## 2021-06-23 NOTE — Patient Instructions (Addendum)
  HEALTH MAINTENANCE RECOMMENDATIONS:  It is recommended that you get at least 30 minutes of aerobic exercise at least 5 days/week (for weight loss, you may need as much as 60-90 minutes). This can be any activity that gets your heart rate up. This can be divided in 10-15 minute intervals if needed, but try and build up your endurance at least once a week.  Weight bearing exercise is also recommended twice weekly.  Eat a healthy diet with lots of vegetables, fruits and fiber.  "Colorful" foods have a lot of vitamins (ie green vegetables, tomatoes, red peppers, etc).  Limit sweet tea, regular sodas and alcoholic beverages, all of which has a lot of calories and sugar.  Up to 2 alcoholic drinks daily may be beneficial for men (unless trying to lose weight, watch sugars).  Drink a lot of water.  Sunscreen of at least SPF 30 should be used on all sun-exposed parts of the skin when outside between the hours of 10 am and 4 pm (not just when at beach or pool, but even with exercise, golf, tennis, and yard work!)  Use a sunscreen that says "broad spectrum" so it covers both UVA and UVB rays, and make sure to reapply every 1-2 hours.  Remember to change the batteries in your smoke detectors when changing your clock times in the spring and fall.  Carbon monoxide detectors are recommended for your home.  Use your seat belt every time you are in a car, and please drive safely and not be distracted with cell phones and texting while driving.  When you next need Flovent refill, maybe we should switch to a steroid/LABA combination (Qvar, Symbicort, Advair, etc). This has a long acting medicine similar to albuterol, and will give you more immediate relief--likely not needing the albuterol in addition unless you're really sick.  Acetazolamide--technically you don't meet the criteria for prevention.  We discussed the potential risks and side effects. I'm sending the prescription for you to try, if desired--starting 1  day prior, and continuing for 2-3 days.  Stop it if you have side effects (that may be worse than your symptoms!). The dose is 2 pills twice daily if you develop significant altitude sickness.

## 2021-06-23 NOTE — Progress Notes (Signed)
Chief Complaint  Patient presents with   Annual Exam    Fasting annual exam. Requesting rx for acetazolamide for trip to [place] in January.    Kevin Chan is a 45 y.o. male who presents for a complete physical.    Asking about acetazolamide. He is going skiing in [place] with his family. His daughter had altitude sickness in [place] last year and has acetazolamide to use preventatively prior to this trip. Patient recalls losing a day and half due to feeling bad previously on ski trips.  He is hoping to avoid this and asking about prophylaxis.  Seen in July with scrotal mass/concerns.  US showed: IMPRESSION: 1. Negative for acute testicular torsion or intratesticular mass. 2. Small epididymal cysts 3. Trace bilateral hydroceles He no longer has any discomfort, hasn't noticed any changes.  Last year he reported some sleep issues.  He has always had trouble "turning his brain off at night, especially if he wakes up at 3am, thinks of all the things he needs to do.  This occurs about 1-2x/week, can never sleep past 6, never took it (worried about side effects).  Internal hemorrhoids, with h/o bleeding and itching. He was prescribed Anusol HC suppositories, which he uses prn flares.  He had a flare in the last year, contacted telemed for a refill, still has at least 8 at home.  He had normal colonoscopy in 01/2015 with [name].  Due for 5 year follow-up (due to FHx of colon polyps and colon cancer).   His insurance doesn't cover this.  Plans to get this done within the first 6 months of the year--will check with providers and let us know if he needs a referral (will go with who has best cash-pay discount--previously saw [name], but willing to switch to Dr. Havery Moros whom he knows).   Hyperlipidemia: taking lipitor 10 mg daily; requires daily coenzyme q10 to avoid the myalgias. (he didn't tolerate Crestor in the past). Continues on same diet as last year --less carbs, more protein (including beef  2-3x/week, rare eggs). Lab Results  Component Value Date   CHOL 198 06/11/2020   HDL 58 06/11/2020   LDLCALC 122 (H) 06/11/2020   TRIG 102 06/11/2020   CHOLHDL 3.4 06/11/2020   He donates blood regularly (double red), three times/year, last time was in August. He knows NOT to donate prior to going skiing/altitude.   Allergies/asthma: Previously got allergy shots with [name]. Has been doing well. Asthma is controlled with singulair daily. He uses Flovent HFA once daily, seasonally (spring/fall), rarely increases it to BID, only if sick. He is not currently using it.  Only needs albuterol with illness, hasn't needed in a while (over the summer). Last spirometry was 01/2018, showing mild restriction.   Allergic rhinitis-- controlled with flonase (year-round), zyrtec or allegra, and singulair daily. He does sinus rinses regularly as well. Uses Astelin only prn, mostly during allergy season in the Spring.  Immunization History  Administered Date(s) Administered   Influenza Split 05/30/2009, 05/27/2010, 05/12/2011   Influenza, Seasonal, Injecte, Preservative Fre 05/13/2014   Influenza,inj,Quad PF,6+ Mos 04/21/2015, 05/17/2016, 05/30/2018   Influenza,inj,quad, With Preservative 05/19/2020   Influenza-Unspecified 04/21/2015, 05/18/2019   Moderna Sars-Covid-2 Vaccination 10/15/2019, 11/12/2019   Pneumococcal Polysaccharide-23 02/19/2016   Tdap 01/13/2009, 03/01/2018  Has gotten flu and COVID booster at pharmacy Last colonoscopy: 01/2015 with [name]; repeat recommended in 5 years Last PSA: never   Dentist: twice yearly   Ophtho: every 2-3 years Exercise:  Recently  re-opened his gym, going 2-3x/week for the last few weeks.  Prior to that, sporadic exercise only.  Skiing in the winter and golfs (walks the course more half the time). Hasn't been walking or doing yoga.   PMH, PSH, SH and FH were reviewed and updated.  Outpatient Encounter Medications as of 06/24/2021  Medication Sig  Note   acetaZOLAMIDE (DIAMOX) 125 MG tablet Take 1 tablet twice daily, starting 1 day prior to and for 2-3 days at elevation for prevention. 2 tablets twice daily for illness    cetirizine (ZYRTEC) 10 MG tablet Take 10 mg by mouth daily.    Coenzyme Q10 300 MG CAPS Take 600 mg by mouth as needed. 01/09/2015: Takes daily (gets myalgias if he doesn't)   Multiple Vitamins-Minerals (MULTIVITAMIN WITH MINERALS) tablet Take 1 tablet by mouth daily.      Zinc 30 MG CAPS Take 1 capsule by mouth daily.    [DISCONTINUED] atorvastatin (LIPITOR) 10 MG tablet Take 1 tablet (10 mg total) by mouth daily.    [DISCONTINUED] montelukast (SINGULAIR) 10 MG tablet Take 1 tablet (10 mg total) by mouth at bedtime.    albuterol (PROVENTIL HFA;VENTOLIN HFA) 108 (90 Base) MCG/ACT inhaler Inhale 2 puffs into the lungs every 6 (six) hours as needed for wheezing or shortness of breath. (Patient not taking: Reported on 03/01/2018) 02/05/2021: prn   atorvastatin (LIPITOR) 10 MG tablet Take 1 tablet (10 mg total) by mouth daily.    azelastine (ASTELIN) 0.1 % nasal spray Place 1-2 sprays into both nostrils 2 (two) times daily. Reported on 02/19/2016 (Patient not taking: Reported on 05/23/2019)    fexofenadine (ALLEGRA) 180 MG tablet Take 180 mg by mouth daily.  (Patient not taking: Reported on 06/24/2021)    FLOVENT HFA 110 MCG/ACT inhaler INHALE ONE PUFF BY MOUTH INTO THE LUNGS TWICE DAILY  (Patient not taking: Reported on 06/24/2021)    fluticasone (FLONASE) 50 MCG/ACT nasal spray Place 2 sprays into both nostrils daily.  (Patient not taking: Reported on 06/24/2021) 06/24/2021: Sporadically   montelukast (SINGULAIR) 10 MG tablet Take 1 tablet (10 mg total) by mouth at bedtime.    No facility-administered encounter medications on file as of 06/24/2021.   NOT taking diamox prior to visit  Allergies  Allergen Reactions   Cashews [Nuts] Other (See Comments)    thrush    ROS: The patient denies anorexia, fever, headaches, vision  loss, decreased hearing, ear pain, hoarseness, chest pain, palpitations, dizziness, syncope, dyspnea on exertion, cough, swelling, nausea, vomiting, diarrhea, constipation, abdominal pain, melena, hematochezia, indigestion/heartburn, hematuria, incontinence, erectile dysfunction, nocturia, weakened urine stream, dysuria, genital lesions, joint pains, numbness, tingling, weakness, tremor, suspicious skin lesions, depression, anxiety, abnormal bleeding/bruising, or enlarged lymph nodes Denies joint pains. Allergies and asthma are controlled with his current regimen. Internal hemorrhoids aren't flaring. Intermittent insomnia (terminal) per HPI Some triggering of R pinkie, not bothersome   PHYSICAL EXAM:  BP 102/60   Pulse 64   Ht 6' (1.829 m)   Wt 160 lb 3.2 oz (72.7 kg)   BMI 21.73 kg/m   Wt Readings from Last 3 Encounters:  06/24/21 160 lb 3.2 oz (72.7 kg)  02/05/21 163 lb (73.9 kg)  06/11/20 167 lb 12.8 oz (76.1 kg)    General Appearance:   Alert, cooperative, no distress, appears stated age   Head:    Normocephalic, without obvious abnormality, atraumatic     Eyes:    PERRL, conjunctiva/corneas clear, EOM's intact, fundi benign    Ears:  Normal TM's and external ear canals     Nose:    Not examined, wearing mask due to COVID-19 pandemic  Throat:    Not examined, wearing mask due to COVID-19 pandemic  Neck:    Supple, no lymphadenopathy; thyroid: no enlargement/ tenderness/nodules; no carotid bruit or JVD     Back:    Spine nontender, no curvature, ROM normal, no CVA tenderness     Lungs:    Clear to auscultation bilaterally without wheezes, rales or ronchi; respirations unlabored     Chest Wall:    No tenderness or deformity     Heart:    Regular rate and rhythm, S1 and S2 normal, no murmur, rub   or gallop     Breast Exam:    No chest wall tenderness, masses or gynecomastia     Abdomen:    Soft, non-tender, nondistended, normoactive bowel sounds,   no masses, no  hepatosplenomegaly     Genitalia:    Normal male external genitalia without lesions. Circumcised. Testicles without masses. 1cm nodule felt superior/posterior to the testicle, nontender, unchanged.  No inguinal hernias.   Rectal:    Normal sphincter tone, no masses.  Prostate is not enlarged, no nodules or masses. Guaiac negative stool  Extremities:    No clubbing, cyanosis or edema     Pulses:    2+ and symmetric all extremities     Skin:    Skin color, texture, turgor normal, no rashes or lesions.   Lymph nodes:    Cervical, supraclavicular, and inguinal nodes normal     Neurologic:    Normal strength, sensation and gait; reflexes 2+ and symmetric throughout                              Psych:   Normal mood, affect, hygiene and grooming    ASSESSMENT/PLAN:  Annual physical exam - Plan: POCT Urinalysis DIP (Proadvantage Device), Comprehensive metabolic panel, CBC with Differential/Platelet, Lipid panel  Mild asthma without complication, unspecified whether persistent - due for spirometry; not currently using Flonase.  discussed poss switch to steroid/LABA combo for prn use (since pt not using it daily) - Plan: montelukast (SINGULAIR) 10 MG tablet, Spirometry with Graph  Medication monitoring encounter - Plan: Comprehensive metabolic panel, Lipid panel  Allergic rhinitis, unspecified seasonality, unspecified trigger - controlled on current regimen - Plan: montelukast (SINGULAIR) 10 MG tablet  Pure hypercholesterolemia - cont lowfat diet, statin - Plan: atorvastatin (LIPITOR) 10 MG tablet, Lipid panel  Travel advice encounter - discussed his concerns and meds regarding altitude sickness in detail. Risks/SE reviewed. Technically at low risk and meds not rec for prevention  Discussed altitude recommendations, and that technically he is not at high risk, and may have higher risks from the medication.  He has done his research and would like to try this. Rx given.  Will stop if has any side  effects, and continue to try and stay well hydrated, avoid alcohol, ascend slowly   Recommended at least 30 minutes of aerobic activity at least 5 days/week, weight-bearing exercise at least 2x/week; proper sunscreen use reviewed; healthy diet and alcohol recommendations (less than or equal to 2 drinks/day) reviewed; regular seatbelt use; changing batteries in smoke detectors.  Immunization recommendations discussed--continue flu shots yearly. Colonoscopy recommendations reviewed--due now, plans to schedule in 2023 (after checking who has best cash discount; will let us know if needs referral to Dr. Adela Lank or if staying with  Dr. Jessie Foot, in which case referral not likely needed).   F/u 1 year, sooner prn

## 2021-06-24 ENCOUNTER — Encounter: Payer: Self-pay | Admitting: Family Medicine

## 2021-06-24 ENCOUNTER — Ambulatory Visit (INDEPENDENT_AMBULATORY_CARE_PROVIDER_SITE_OTHER): Payer: No Typology Code available for payment source | Admitting: Family Medicine

## 2021-06-24 ENCOUNTER — Other Ambulatory Visit: Payer: Self-pay

## 2021-06-24 VITALS — BP 102/60 | HR 64 | Ht 72.0 in | Wt 160.2 lb

## 2021-06-24 DIAGNOSIS — J45909 Unspecified asthma, uncomplicated: Secondary | ICD-10-CM | POA: Diagnosis not present

## 2021-06-24 DIAGNOSIS — E7800 Pure hypercholesterolemia, unspecified: Secondary | ICD-10-CM | POA: Diagnosis not present

## 2021-06-24 DIAGNOSIS — Z0000 Encounter for general adult medical examination without abnormal findings: Secondary | ICD-10-CM | POA: Diagnosis not present

## 2021-06-24 DIAGNOSIS — Z7184 Encounter for health counseling related to travel: Secondary | ICD-10-CM

## 2021-06-24 DIAGNOSIS — Z5181 Encounter for therapeutic drug level monitoring: Secondary | ICD-10-CM

## 2021-06-24 DIAGNOSIS — J309 Allergic rhinitis, unspecified: Secondary | ICD-10-CM | POA: Diagnosis not present

## 2021-06-24 LAB — POCT URINALYSIS DIP (PROADVANTAGE DEVICE)
Bilirubin, UA: NEGATIVE
Blood, UA: NEGATIVE
Glucose, UA: NEGATIVE mg/dL
Ketones, POC UA: NEGATIVE mg/dL
Leukocytes, UA: NEGATIVE
Nitrite, UA: NEGATIVE
Protein Ur, POC: NEGATIVE mg/dL
Specific Gravity, Urine: 1.015
Urobilinogen, Ur: NEGATIVE
pH, UA: 6 (ref 5.0–8.0)

## 2021-06-24 MED ORDER — ATORVASTATIN CALCIUM 10 MG PO TABS: 10.0000 mg | ORAL_TABLET | Freq: Every day | ORAL | 3 refills | Status: DC

## 2021-06-24 MED ORDER — ACETAZOLAMIDE 125 MG PO TABS: ORAL_TABLET | ORAL | 0 refills | Status: DC

## 2021-06-24 MED ORDER — MONTELUKAST SODIUM 10 MG PO TABS: 10.0000 mg | ORAL_TABLET | Freq: Every day | ORAL | 3 refills | Status: DC

## 2021-06-25 LAB — LIPID PANEL
Chol/HDL Ratio: 3.2 ratio (ref 0.0–5.0)
Cholesterol, Total: 172 mg/dL (ref 100–199)
HDL: 54 mg/dL (ref >39)
LDL Chol Calc (NIH): 99 mg/dL (ref 0–99)
Triglycerides: 103 mg/dL (ref 0–149)
VLDL Cholesterol Cal: 19 mg/dL (ref 5–40)

## 2021-06-25 LAB — COMPREHENSIVE METABOLIC PANEL
ALT: 17 IU/L (ref 0–44)
AST: 14 IU/L (ref 0–40)
Albumin/Globulin Ratio: 1.8 (ref 1.2–2.2)
Albumin: 4.6 g/dL (ref 4.0–5.0)
Alkaline Phosphatase: 75 IU/L (ref 44–121)
BUN/Creatinine Ratio: 24 — ABNORMAL HIGH (ref 9–20)
BUN: 21 mg/dL (ref 6–24)
Bilirubin Total: 0.3 mg/dL (ref 0.0–1.2)
CO2: 28 mmol/L (ref 20–29)
Calcium: 9.7 mg/dL (ref 8.7–10.2)
Chloride: 100 mmol/L (ref 96–106)
Creatinine, Ser: 0.87 mg/dL (ref 0.76–1.27)
Globulin, Total: 2.5 g/dL (ref 1.5–4.5)
Glucose: 88 mg/dL (ref 70–99)
Potassium: 4.7 mmol/L (ref 3.5–5.2)
Sodium: 141 mmol/L (ref 134–144)
Total Protein: 7.1 g/dL (ref 6.0–8.5)
eGFR: 109 mL/min/{1.73_m2} (ref >59)

## 2021-06-25 LAB — CBC WITH DIFFERENTIAL/PLATELET
Basophils Absolute: 0.1 10*3/uL (ref 0.0–0.2)
Basos: 1 %
EOS (ABSOLUTE): 0.2 10*3/uL (ref 0.0–0.4)
Eos: 4 %
Hematocrit: 45.1 % (ref 37.5–51.0)
Hemoglobin: 15.6 g/dL (ref 13.0–17.7)
Immature Grans (Abs): 0 10*3/uL (ref 0.0–0.1)
Immature Granulocytes: 0 %
Lymphocytes Absolute: 1.6 10*3/uL (ref 0.7–3.1)
Lymphs: 29 %
MCH: 29.8 pg (ref 26.6–33.0)
MCHC: 34.6 g/dL (ref 31.5–35.7)
MCV: 86 fL (ref 79–97)
Monocytes Absolute: 0.4 10*3/uL (ref 0.1–0.9)
Monocytes: 7 %
Neutrophils Absolute: 3.3 10*3/uL (ref 1.4–7.0)
Neutrophils: 59 %
Platelets: 307 10*3/uL (ref 150–450)
RBC: 5.24 x10E6/uL (ref 4.14–5.80)
RDW: 12.5 % (ref 11.6–15.4)
WBC: 5.6 10*3/uL (ref 3.4–10.8)

## 2021-09-30 HISTORY — PX: EXTERNAL EAR SURGERY: SHX627

## 2022-01-26 ENCOUNTER — Other Ambulatory Visit: Payer: Self-pay | Admitting: Family Medicine

## 2022-04-07 ENCOUNTER — Encounter: Payer: Self-pay | Admitting: Internal Medicine

## 2022-04-13 ENCOUNTER — Encounter: Payer: Self-pay | Admitting: Family Medicine

## 2022-05-11 ENCOUNTER — Encounter: Payer: Self-pay | Admitting: Internal Medicine

## 2022-06-17 LAB — HM COLONOSCOPY

## 2022-07-04 ENCOUNTER — Encounter: Payer: Self-pay | Admitting: Family Medicine

## 2022-07-04 NOTE — Progress Notes (Unsigned)
No chief complaint on file.  Kevin Chan is a 46 y.o. male who presents for a complete physical.    Skiing last year--used diamox?  He saw Kevin Chan in June with R shoulder pain.  Internal hemorrhoids, with h/o bleeding and itching. He was prescribed Anusol HC suppositories, which he uses prn flares.    He had normal colonoscopy in 01/2015 with Kevin Chan.  Due for 5 year follow-up (due to FHx of colon polyps and colon cancer).   His insurance doesn't cover this. Last year he reported planning to get this done within the first 6 months of the year, but only recently reached out to [place] about this (04/2022).  Scheduled?? ***  Hyperlipidemia: taking lipitor 10 mg daily; requires daily coenzyme q10 to avoid the myalgias. (he didn't tolerate Crestor in the past). Continues on same diet as last year --less carbs, more protein (including beef 2-3x/week, rare eggs). Lab Results  Component Value Date   CHOL 172 06/24/2021   HDL 54 06/24/2021   LDLCALC 99 06/24/2021   TRIG 103 06/24/2021   CHOLHDL 3.2 06/24/2021   He donates blood regularly (double red), three times/year, last time was ***   Allergies/asthma: Previously got allergy shots with Kevin Chan. Has been doing well. Asthma is controlled with singulair daily. He uses Flovent HFA once daily, seasonally (spring/fall), rarely needs to increase it to BID, when sick. He is not currently using it.  Only needs albuterol with illness.   Allergic rhinitis-- He continues on singulair daily. He no longer uses zyrtec or flonase. He has been seeing ENT at [facility], last in July. He had nasal endoscopy at that visit.  It was recommended that he continue daily DDM rinses, and a triple dose mometasone rinse was added once daily .  To continue nasal steroid sprays with allergy flares.  Uses Astelin only prn, mostly during allergy season in the Spring.  UPDATE  Insomnia--He has always had trouble "turning his brain off at night, especially if he wakes up  at 3am, thinks of all the things he needs to do.  This occurs about 1-2x/week, can never sleep past 6, never took medications prescribed (worried about side effects).   Immunization History  Administered Date(s) Administered   Influenza Split 05/30/2009, 05/27/2010, 05/12/2011   Influenza, Seasonal, Injecte, Preservative Fre 05/13/2014   Influenza,inj,Quad PF,6+ Mos 04/21/2015, 05/17/2016, 05/30/2018   Influenza,inj,quad, With Preservative 05/19/2020   Influenza-Unspecified 04/21/2015, 05/18/2019, 05/15/2021   Moderna Sars-Covid-2 Vaccination 10/15/2019, 11/12/2019, 07/05/2020, 05/15/2021   Pneumococcal Polysaccharide-23 02/19/2016   Tdap 01/13/2009, 03/01/2018  Has gotten flu and COVID booster at pharmacy *** Last colonoscopy: 01/2015 with Kevin Chan; repeat recommended in 5 years Last PSA: never   Dentist: twice yearly   Ophtho: every 2-3 years Exercise:  gym  2-3x/week Skiing in the winter and golfs (walks the course more half the time). Hasn't been walking or doing yoga.   PMH, PSH, SH and FH were reviewed and updated.  [place] 01/2021 IMPRESSION: 1. Negative for acute testicular torsion or intratesticular mass. 2. Small epididymal cysts 3. Trace bilateral hydroceles He denies any discomfort, hasn't noticed any changes.     ROS: The patient denies anorexia, fever, headaches, vision loss, decreased hearing, ear pain, hoarseness, chest pain, palpitations, dizziness, syncope, dyspnea on exertion, cough, swelling, nausea, vomiting, diarrhea, constipation, abdominal pain, melena, hematochezia, indigestion/heartburn, hematuria, incontinence, erectile dysfunction, nocturia, weakened urine stream, dysuria, genital lesions, joint pains, numbness, tingling, weakness, tremor, suspicious skin lesions, depression, anxiety, abnormal bleeding/bruising,  or enlarged lymph nodes Denies joint pains. ?R shoulder *** Allergies and asthma are controlled with his current regimen. Internal hemorrhoids  aren't flaring. Intermittent insomnia (terminal) per HPI Some triggering of R pinkie, not bothersome   PHYSICAL EXAM:  There were no vitals taken for this visit.  Wt Readings from Last 3 Encounters:  06/24/21 160 lb 3.2 oz (72.7 kg)  02/05/21 163 lb (73.9 kg)  06/11/20 167 lb 12.8 oz (76.1 kg)    General Appearance:   Alert, cooperative, no distress, appears stated age   Head:    Normocephalic, without obvious abnormality, atraumatic     Eyes:    PERRL, conjunctiva/corneas clear, EOM's intact, fundi benign    Ears:    Normal TM's and external ear canals     Nose:    No drainage, sinuses nontender  Throat:    Normal mucosa  Neck:    Supple, no lymphadenopathy; thyroid: no enlargement/ tenderness/nodules; no carotid bruit or JVD     Back:    Spine nontender, no curvature, ROM normal, no CVA tenderness     Lungs:    Clear to auscultation bilaterally without wheezes, rales or ronchi; respirations unlabored     Chest Wall:    No tenderness or deformity     Heart:    Regular rate and rhythm, S1 and S2 normal, no murmur, rub   or gallop     Breast Exam:    No chest wall tenderness, masses or gynecomastia     Abdomen:    Soft, non-tender, nondistended, normoactive bowel sounds,   no masses, no hepatosplenomegaly     Genitalia:    Normal male external genitalia without lesions. Circumcised. Testicles without masses. 1cm nodule felt superior/posterior to the testicle, nontender, unchanged.  No inguinal hernias.   Rectal:    Normal sphincter tone, no masses.  Prostate is not enlarged, no nodules or masses. Guaiac negative stool  Extremities:    No clubbing, cyanosis or edema     Pulses:    2+ and symmetric all extremities     Skin:    Skin color, texture, turgor normal, no rashes or lesions.   Lymph nodes:    Cervical, supraclavicular, and inguinal nodes normal     Neurologic:    Normal strength, sensation and gait; reflexes 2+ and symmetric throughout                              Psych:    Normal mood, affect, hygiene and grooming   ***update nodule sup/post to testicle?? WHICH SIDE?  ASSESSMENT/PLAN:  Enter/offer flu, COVID (likely got from pharmacy) Did he schedule colonoscopy?  Lipid, c-met, cbc  RF lipitor after labs back RF montelukast   Recommended at least 30 minutes of aerobic activity at least 5 days/week, weight-bearing exercise at least 2x/week; proper sunscreen use reviewed; healthy diet and alcohol recommendations (less than or equal to 2 drinks/day) reviewed; regular seatbelt use; changing batteries in smoke detectors.  Immunization recommendations discussed--continue flu shots yearly. Colonoscopy recommendations reviewed--past due.   F/u 1 year, sooner prn

## 2022-07-04 NOTE — Patient Instructions (Signed)

## 2022-07-05 ENCOUNTER — Encounter: Payer: Self-pay | Admitting: Family Medicine

## 2022-07-05 ENCOUNTER — Encounter: Payer: Self-pay | Admitting: *Deleted

## 2022-07-05 ENCOUNTER — Ambulatory Visit (INDEPENDENT_AMBULATORY_CARE_PROVIDER_SITE_OTHER): Payer: Self-pay | Admitting: Family Medicine

## 2022-07-05 VITALS — BP 100/64 | HR 60 | Ht 72.0 in | Wt 166.8 lb

## 2022-07-05 DIAGNOSIS — Z0000 Encounter for general adult medical examination without abnormal findings: Secondary | ICD-10-CM

## 2022-07-05 DIAGNOSIS — Z5181 Encounter for therapeutic drug level monitoring: Secondary | ICD-10-CM

## 2022-07-05 DIAGNOSIS — J309 Allergic rhinitis, unspecified: Secondary | ICD-10-CM

## 2022-07-05 DIAGNOSIS — E7800 Pure hypercholesterolemia, unspecified: Secondary | ICD-10-CM

## 2022-07-05 DIAGNOSIS — J45909 Unspecified asthma, uncomplicated: Secondary | ICD-10-CM

## 2022-07-05 DIAGNOSIS — G4700 Insomnia, unspecified: Secondary | ICD-10-CM

## 2022-07-05 LAB — POCT URINALYSIS DIP (PROADVANTAGE DEVICE)
Bilirubin, UA: NEGATIVE
Blood, UA: NEGATIVE
Glucose, UA: NEGATIVE mg/dL
Ketones, POC UA: NEGATIVE mg/dL
Leukocytes, UA: NEGATIVE
Nitrite, UA: NEGATIVE
Specific Gravity, Urine: 1.015
Urobilinogen, Ur: 0.2
pH, UA: 6 (ref 5.0–8.0)

## 2022-07-05 MED ORDER — MONTELUKAST SODIUM 10 MG PO TABS: 10.0000 mg | ORAL_TABLET | Freq: Every day | ORAL | 3 refills | Status: DC

## 2022-07-05 MED ORDER — HYDROCORTISONE ACETATE 25 MG RE SUPP: 25.0000 mg | Freq: Two times a day (BID) | RECTAL | 0 refills | Status: DC

## 2022-07-05 MED ORDER — ZOLPIDEM TARTRATE 10 MG PO TABS: 10.0000 mg | ORAL_TABLET | Freq: Every evening | ORAL | 1 refills | Status: DC | PRN

## 2022-07-06 LAB — COMPREHENSIVE METABOLIC PANEL
ALT: 26 IU/L (ref 0–44)
AST: 23 IU/L (ref 0–40)
Albumin/Globulin Ratio: 2.2 (ref 1.2–2.2)
Albumin: 4.9 g/dL (ref 4.1–5.1)
Alkaline Phosphatase: 77 IU/L (ref 44–121)
BUN/Creatinine Ratio: 21 — ABNORMAL HIGH (ref 9–20)
BUN: 20 mg/dL (ref 6–24)
Bilirubin Total: 0.6 mg/dL (ref 0.0–1.2)
CO2: 22 mmol/L (ref 20–29)
Calcium: 9.8 mg/dL (ref 8.7–10.2)
Chloride: 100 mmol/L (ref 96–106)
Creatinine, Ser: 0.95 mg/dL (ref 0.76–1.27)
Globulin, Total: 2.2 g/dL (ref 1.5–4.5)
Glucose: 99 mg/dL (ref 70–99)
Potassium: 4.9 mmol/L (ref 3.5–5.2)
Sodium: 140 mmol/L (ref 134–144)
Total Protein: 7.1 g/dL (ref 6.0–8.5)
eGFR: 101 mL/min/{1.73_m2} (ref >59)

## 2022-07-06 LAB — CBC WITH DIFFERENTIAL/PLATELET
Basophils Absolute: 0 10*3/uL (ref 0.0–0.2)
Basos: 1 %
EOS (ABSOLUTE): 0.2 10*3/uL (ref 0.0–0.4)
Eos: 3 %
Hematocrit: 45 % (ref 37.5–51.0)
Hemoglobin: 15.5 g/dL (ref 13.0–17.7)
Immature Grans (Abs): 0 10*3/uL (ref 0.0–0.1)
Immature Granulocytes: 0 %
Lymphocytes Absolute: 1.5 10*3/uL (ref 0.7–3.1)
Lymphs: 26 %
MCH: 29.8 pg (ref 26.6–33.0)
MCHC: 34.4 g/dL (ref 31.5–35.7)
MCV: 87 fL (ref 79–97)
Monocytes Absolute: 0.5 10*3/uL (ref 0.1–0.9)
Monocytes: 9 %
Neutrophils Absolute: 3.5 10*3/uL (ref 1.4–7.0)
Neutrophils: 61 %
Platelets: 275 10*3/uL (ref 150–450)
RBC: 5.2 x10E6/uL (ref 4.14–5.80)
RDW: 12.8 % (ref 11.6–15.4)
WBC: 5.8 10*3/uL (ref 3.4–10.8)

## 2022-07-06 LAB — LIPID PANEL
Chol/HDL Ratio: 3 ratio (ref 0.0–5.0)
Cholesterol, Total: 166 mg/dL (ref 100–199)
HDL: 55 mg/dL (ref >39)
LDL Chol Calc (NIH): 93 mg/dL (ref 0–99)
Triglycerides: 97 mg/dL (ref 0–149)
VLDL Cholesterol Cal: 18 mg/dL (ref 5–40)

## 2022-07-06 MED ORDER — ATORVASTATIN CALCIUM 10 MG PO TABS: 10.0000 mg | ORAL_TABLET | Freq: Every day | ORAL | 3 refills | Status: DC

## 2022-07-15 ENCOUNTER — Encounter: Payer: Self-pay | Admitting: *Deleted

## 2022-10-05 ENCOUNTER — Encounter: Payer: Self-pay | Admitting: Family Medicine

## 2022-10-05 ENCOUNTER — Ambulatory Visit (INDEPENDENT_AMBULATORY_CARE_PROVIDER_SITE_OTHER): Payer: Self-pay | Admitting: Family Medicine

## 2022-10-05 VITALS — BP 102/70 | HR 77 | Temp 98.9°F | Ht 72.0 in | Wt 167.2 lb

## 2022-10-05 DIAGNOSIS — J40 Bronchitis, not specified as acute or chronic: Secondary | ICD-10-CM

## 2022-10-05 NOTE — Progress Notes (Signed)
   Subjective:    Patient ID: Kevin Chan, male    DOB: May 22, 1976, 47 y.o.   MRN: [id number]  HPI He states that last Saturday he developed a sore throat that progressed to sinus congestion, cough that has become productive.  He did do a virtual visit and was placed on amoxicillin and has been on it for approximately 3 days.  The coughing is better but he notes worse at night   Review of Systems     Objective:   Physical Exam Alert and in no distress. Tympanic membranes and canals are normal. Pharyngeal area is normal. Neck is supple without adenopathy or thyromegaly. Cardiac exam shows a regular sinus rhythm without murmurs or gallops. Lungs are clear to auscultation       Assessment & Plan:  Bronchitis I encouraged him to take the entire antibiotic with mild leftover.  He may use an over-the-counter cough medicine as needed.  He was comfortable with that.

## 2022-11-10 ENCOUNTER — Ambulatory Visit: Payer: Self-pay | Admitting: Family Medicine

## 2022-11-11 ENCOUNTER — Ambulatory Visit: Payer: Self-pay | Admitting: Family Medicine

## 2022-11-11 ENCOUNTER — Encounter: Payer: Self-pay | Admitting: Family Medicine

## 2022-11-11 VITALS — BP 100/70 | HR 70 | Ht 73.0 in | Wt 169.2 lb

## 2022-11-11 DIAGNOSIS — R079 Chest pain, unspecified: Secondary | ICD-10-CM

## 2022-11-11 DIAGNOSIS — J309 Allergic rhinitis, unspecified: Secondary | ICD-10-CM

## 2022-11-11 DIAGNOSIS — J45909 Unspecified asthma, uncomplicated: Secondary | ICD-10-CM

## 2022-11-11 MED ORDER — ALBUTEROL SULFATE HFA 108 (90 BASE) MCG/ACT IN AERS: 2.0000 | INHALATION_SPRAY | Freq: Four times a day (QID) | RESPIRATORY_TRACT | 0 refills | Status: AC | PRN

## 2022-11-11 NOTE — Patient Instructions (Addendum)
Go to [facility] for chest x-ray. EKG didn't show any evidence of coronary disease. Take Mucinex twice daliy. Stay well hydrated.  If x-ray is okay, we will plan on a medrol dosepak. Antibiotics if it shows any infection.  Keep [place] updated on how you feel.

## 2022-11-11 NOTE — Progress Notes (Signed)
Chief Complaint  Patient presents with   Breathing Problem    Pt complains of breathing irregularities, been going on for 3-4 weeks. Pt states that he has to really concentrate to breathe. On going coughing. States that it is making his back sore.     He got sick while in [place] skiing--terrible sore throat, then into chest with coughing.  He had a virtual visit and was started on amoxil.  3 days later he saw Dr. Susann Givens (3/5).  Exam was normal, and he was encouraged to complete the antibiotic and use OTC cough medication.  He had gotten better, but gradually getting worse again. Never got to 100%. Cough is nonproductive. He has been using albuterol every 1-2 days, doesn't notice a benefit.  Needs a refill.  Sometimes lungs feel like they are in a box, can only get in so much air, and only way to get a deep breath is to yawn.  He previously got this feeling, sometimes often, often related to allergy season. This is currently occurring more often, even felt it laying in bed. It comes and goes.  He has had a lot of travel--has been to [facility], and skiing in [place] all since this illness.  Wife noticed his breathing seemed different when in [place]. [place], while laying in bed--more effort. Coughs more with deep breaths.  Has continues to be active. Playing pickleball, mowed the yard, spread fertilizer.  He felt a little more winded with these activities than at his baseline.  He has soreness in his low back, and lower chest (mid-chest). Feel "sore".  Had a massage yesterday, only temporarily help of the soreness. Yawning resolves the discomfort.  Has just slight heartburn; started taking pepcid in the past couple of days, hasn't noticed any difference.  Doesn't feel like allergies are flaring. Using Flovent BID with spacer.  Taking Allegra BID (per prior allergist recs)  He is walking around carrying aspirin, worried about his heart.  His father was 45 with his first bypass surgery. He is very  worried about his heart, would like an EKG,and also asking for CXR.  Had 2 episodes of pna since age 63.   PMH, PSH, SH reviewed  Outpatient Encounter Medications as of 11/11/2022  Medication Sig Note   albuterol (PROVENTIL HFA;VENTOLIN HFA) 108 (90 Base) MCG/ACT inhaler Inhale 2 puffs into the lungs every 6 (six) hours as needed for wheezing or shortness of breath. 11/11/2022: Needs refill.    atorvastatin (LIPITOR) 10 MG tablet Take 1 tablet (10 mg total) by mouth daily.    Coenzyme Q10 300 MG CAPS Take 600 mg by mouth as needed. 11/11/2022: Takes daily (gets myalgias if he doesn't)   fexofenadine (ALLEGRA) 180 MG tablet Take 180 mg by mouth daily.    fluticasone (FLOVENT HFA) 110 MCG/ACT inhaler INHALE ONE PUFF BY MOUTH INTO THE LUNGS TWICE DAILY 11/11/2022: Using daily but does not work well   montelukast (SINGULAIR) 10 MG tablet Take 1 tablet (10 mg total) by mouth at bedtime.    NONFORMULARY OR COMPOUNDED ITEM 1.5 mLs daily. 1.71ml of medication to of saline in saline irrigation bottle:irrigate sinuses with through each nostril daily 07/05/2022: Mometasone 1.2mg /ml   Zinc 30 MG CAPS Take 1 capsule by mouth daily.    [DISCONTINUED] amoxicillin (AMOXIL) 875 MG tablet Take 875 mg by mouth 2 (two) times daily. (Patient not taking: Reported on 11/11/2022)    [DISCONTINUED] hydrocortisone (ANUSOL-HC) 25 MG suppository Place 1 suppository (25 mg total) rectally  2 (two) times daily. (Patient not taking: Reported on 10/05/2022) 11/11/2022: As needed.    [DISCONTINUED] zolpidem (AMBIEN) 10 MG tablet Take 1 tablet (10 mg total) by mouth at bedtime as needed for sleep.    No facility-administered encounter medications on file as of 11/11/2022.   Allergies  Allergen Reactions   Cashews [Nuts] Other (See Comments)    thrush    ROS: no fever, chills, headaches.  Slight dizziness only when paying close attention to his breathing, when in be. Intermittent soreness all over the chest, currently only  discomfort is at the lower sternum   PHYSICAL EXAM:  BP 100/70   Pulse 70   Ht 6\' 1"  (1.854 m)   Wt 169 lb 3.2 oz (76.7 kg)   SpO2 99%   BMI 22.32 kg/m   Wt Readings from Last 3 Encounters:  11/11/22 169 lb 3.2 oz (76.7 kg)  10/05/22 167 lb 3.2 oz (75.8 kg)  07/05/22 166 lb 12.8 oz (75.7 kg)   Pleasant male, somewhat anxious. Speaking quickly--nervous/anxious.  Speaking easily and comfortably. Occasional dry, hacky cough during visit. HEENT: conjunctiva and sclera are clear, EOMI. TM's and EAC's normal.  Nasal mucosa--only mildly edematous, some areas with recent bleeding.  White mucus is present bilaterally. Sinuses nontender. OP clear Neck: no lymphadenopathy or mass Heart: regular rate and rhythm Lungs--slightly coarse posteriorly on the left, which cleared during exam. No ronchi, rales or wheezing. Chest: Very mild diffuse tenderness across chest, nothing focal. Focal tenderness at xiphoid process and below, in the upper portion of the epigastrium. Abdomen: mildly tender in upper epigastrium. No organomegaly or mass. No rebound tenderness or guarding Extremities: no edema  EKG: NSR, rate 60.  No acute abnormalities   ASSESSMENT/PLAN:  Chest pain, unspecified type - discomfort in lower chest, feels difficulty getting deep breaths. EKG reassuring. Cont pepcid, reflux may be contributing - Plan: EKG 12-Lead  Mild asthma without complication, unspecified whether persistent - cont albuterol prn. May need a course of steroid. Await CXR - Plan: albuterol (VENTOLIN HFA) 108 (90 Base) MCG/ACT inhaler  Allergic rhinitis, unspecified seasonality, unspecified trigger - overall controlled.  White mucus in nares may contribute to PND. Mucinex rec, cont sinus rinses  Pt cash-pay.  Given form to get stat CXR at [facility] Refill albuterol  Stay well hydrated. Consider taking some mucinex to see if that helps with chest congestion and if it loosens anything up.  Will likely  benefit from a course of steroids, but await CXR results. Tolerates medrol better than prednisone

## 2022-11-12 ENCOUNTER — Encounter: Payer: Self-pay | Admitting: Family Medicine

## 2022-11-12 ENCOUNTER — Telehealth: Payer: Self-pay

## 2022-11-12 MED ORDER — METHYLPREDNISOLONE 4 MG PO TBPK: ORAL_TABLET | ORAL | 0 refills | Status: DC

## 2022-11-12 NOTE — Telephone Encounter (Signed)
Pt called to check on his xray results. I advised him we are working to get the results.

## 2022-11-12 NOTE — Telephone Encounter (Signed)
Called and spoke to [facility] and it has not been read yet. Sending to radiology to be read and will let us know

## 2022-11-15 MED ORDER — METHOCARBAMOL 500 MG PO TABS: 500.0000 mg | ORAL_TABLET | Freq: Four times a day (QID) | ORAL | 0 refills | Status: DC | PRN

## 2022-11-15 NOTE — Addendum Note (Signed)
Addended byJoselyn Arrow on: 11/15/2022 12:20 PM   Modules accepted: Orders

## 2022-11-16 ENCOUNTER — Other Ambulatory Visit: Payer: Self-pay | Admitting: Orthopedic Surgery

## 2022-11-16 DIAGNOSIS — I2699 Other pulmonary embolism without acute cor pulmonale: Secondary | ICD-10-CM

## 2022-11-17 ENCOUNTER — Encounter: Payer: Self-pay | Admitting: Family Medicine

## 2022-11-17 ENCOUNTER — Ambulatory Visit (INDEPENDENT_AMBULATORY_CARE_PROVIDER_SITE_OTHER): Payer: Self-pay | Admitting: Family Medicine

## 2022-11-17 ENCOUNTER — Encounter: Payer: Self-pay | Admitting: Orthopedic Surgery

## 2022-11-17 ENCOUNTER — Ambulatory Visit
Admission: RE | Admit: 2022-11-17 | Discharge: 2022-11-17 | Disposition: A | Discharge status: Home / Self Care | Payer: No Typology Code available for payment source | Source: Ambulatory Visit | Attending: Orthopedic Surgery | Admitting: Orthopedic Surgery

## 2022-11-17 VITALS — BP 104/70 | HR 72 | Ht 73.0 in | Wt 166.8 lb

## 2022-11-17 DIAGNOSIS — F419 Anxiety disorder, unspecified: Secondary | ICD-10-CM

## 2022-11-17 DIAGNOSIS — R079 Chest pain, unspecified: Secondary | ICD-10-CM

## 2022-11-17 DIAGNOSIS — J45909 Unspecified asthma, uncomplicated: Secondary | ICD-10-CM

## 2022-11-17 DIAGNOSIS — I2699 Other pulmonary embolism without acute cor pulmonale: Secondary | ICD-10-CM

## 2022-11-17 DIAGNOSIS — J309 Allergic rhinitis, unspecified: Secondary | ICD-10-CM

## 2022-11-17 MED ORDER — ALPRAZOLAM 0.5 MG PO TABS
0.2500 mg | ORAL_TABLET | Freq: Three times a day (TID) | ORAL | 0 refills | Status: AC | PRN
Start: 2022-11-17 — End: ?

## 2022-11-17 MED ORDER — IOPAMIDOL (ISOVUE-370) INJECTION 76%
75.0000 mL | Freq: Once | INTRAVENOUS | Status: AC | PRN
  Administered 2022-11-17: 75 mL via INTRAVENOUS

## 2022-11-17 NOTE — Patient Instructions (Signed)
Use the alprazolam very sparingly, when extremely anxious. Only take 1/2 tablet if using during the day, a full tablet if at night. Do not mix with valium, alcohol or other sedating medications.  If the scan today is normal, then anxiety is likely a major contributing factor to your symptoms. I encourage you to consider counseling, given the stressors related to your family. If you have ongoing anxiety, we need to discuss more preventative medications, such as SSRI's (zoloft, lexapro, etc), or Buspar.  We discussed your CT coronary scan--good idea to do in the future.  Will definitely overlap with the lung CT today, so no rush to do this.  We will discuss again at your physical.

## 2022-11-17 NOTE — Progress Notes (Signed)
Chief Complaint  Patient presents with   Follow-up    Still having ongoing breathing issues. Finished steroid pack this morning. Going for CT Angio today.     Patient presents for follow-up on his breathing concerns.  See last visit for details. He was treated with a Medrol dosepak after normal x-ray. Last dose was today.  Cough improved (50-75% better) with steroid course, but the breathing issue hasn't changed. Added mucinex and sudafed to his regimen.  Slight remaining cough is still nonproductive. He started a 14 d course of OTC Nexium--has taken for 5 days. Hasn't noticed much change in symptoms.  Spoke with Dr. Adela Lank, who told him he may benefit from being on PPI longer than 14d. He said to talk to PCP  about Cardiac CT (due to FH), also mentioned checking D-dimer.  He saw Dr. Darrelyn Hillock yesterday for what he thought was rib discomfort (taking a muscle relaxant one night had helped, thought it could be MSK) the rib discomfort.  He thought the robaxin helped at first (tried his wife's), he took 4 doses total, doesn't notice much improvement.  Dr. Darrelyn Hillock said it wasn't his heart or a rib problem.  Thinks he picked up something with traveling (virus).  He ordered CT angiogram, and will get this done today  Today he states he couldn't sleep last night, was wound up.  Wife is out of the country on a cruise. Spoke to a pharmacist friend--took a xanax 2 nights in a row (helped some--helped shut his mind down, but also helped with the chest discomfort). After speaking to the pharmacist last night, tried valium  (also his wife's), which didn't seem to help. The xanaxl helped more.  He does admit that anxiety may be a factor.  He has a lot of stress related to their 42 year old daughter (found out 4 months ago that she had sex).  He also notices that in the last year he has developed more of a fear of bridges and heights  (mainly bridges). His wife is coming home Saturday.  He can't wait until  she comes home, to give her a big hug.  PMH, PSH, SH reviewed   Outpatient Encounter Medications as of 11/17/2022  Medication Sig Note   albuterol (VENTOLIN HFA) 108 (90 Base) MCG/ACT inhaler Inhale 2 puffs into the lungs every 6 (six) hours as needed for wheezing or shortness of breath. 11/17/2022: Used today   atorvastatin (LIPITOR) 10 MG tablet Take 1 tablet (10 mg total) by mouth daily.    Coenzyme Q10 300 MG CAPS Take 600 mg by mouth as needed. 11/11/2022: Takes daily (gets myalgias if he doesn't)   fexofenadine (ALLEGRA) 180 MG tablet Take 180 mg by mouth daily.    fluticasone (FLOVENT HFA) 110 MCG/ACT inhaler INHALE ONE PUFF BY MOUTH INTO THE LUNGS TWICE DAILY 11/11/2022: Using daily but does not work well   guaiFENesin (MUCINEX PO) Take 1 tablet by mouth every 4 (four) hours.    methylPREDNISolone (MEDROL DOSEPAK) 4 MG TBPK tablet Take as directed 11/17/2022: Took last one today   montelukast (SINGULAIR) 10 MG tablet Take 1 tablet (10 mg total) by mouth at bedtime.    NONFORMULARY OR COMPOUNDED ITEM 1.5 mLs daily. 1.50ml of medication to of saline in saline irrigation bottle:irrigate sinuses with through each nostril daily 07/05/2022: Mometasone 1.2mg /ml   pseudoephedrine (SUDAFED) 30 MG tablet Take 30 mg by mouth every 4 (four) hours as needed for congestion.    Zinc 30  MG CAPS Take 1 capsule by mouth daily.    [DISCONTINUED] ALPRAZolam (XANAX) 0.5 MG tablet Take 0.5 mg by mouth at bedtime as needed for anxiety. 11/17/2022: Took Monday night   [DISCONTINUED] diazepam (VALIUM) 5 MG tablet Take 5 mg by mouth every 6 (six) hours as needed for anxiety. 11/17/2022: Took one last night   ALPRAZolam (XANAX) 0.5 MG tablet Take 0.5-1 tablets (0.25-0.5 mg total) by mouth 3 (three) times daily as needed for anxiety.    methocarbamol (ROBAXIN) 500 MG tablet Take 1 tablet (500 mg total) by mouth every 6 (six) hours as needed for muscle spasms. (Patient not taking: Reported on 11/17/2022)    No  facility-administered encounter medications on file as of 11/17/2022.   Allergies  Allergen Reactions   Cashews [Nuts] Other (See Comments)    thrush   ROS: no fever, chills, headache, dizziness, chest pain. Trouble sometimes getting enough air.  No DOE. No edema. No depression.  + anxiety. No GI or GU complaints. Allergies, and cough improved with steroid.    PHYSICAL EXAM:  BP 104/70   Pulse 72   Ht  (1.854 m)   Wt 166 lb 12.8 oz (75.7 kg)   BMI 22.01 kg/m   Wt Readings from Last 3 Encounters:  11/17/22 166 lb 12.8 oz (75.7 kg)  11/11/22 169 lb 3.2 oz (76.7 kg)  10/05/22 167 lb 3.2 oz (75.8 kg)   Pleasant male, very talkative, speaking quickly, in no distress Some throat-clearing was noted during the visit, no cough. He appeared to be breathing normally, no deep sighs or abnormality noted. Neck: no lymphadenopathy or mass Heart: regular rate and rhythm Lungs: clear bilaterally Abdomen: soft, nontender Extremities: no edema Psych: anxious, in good spirits, full range of affect. Normal hygiene and grooming   ASSESSMENT/PLAN:  Anxiety - Discussed preventative vs prn treatments, counseling.  Encouraged counseling first. Risks/SE of xanax reviewed in detail. F/u if needs prevention - Plan: ALPRAZolam (XANAX) 0.5 MG tablet  Chest pain, unspecified type - a slight tightness, trouble getting deep breath. Already has a CT angio scheduled for when he leaves the office. See discussion  Allergic rhinitis, unspecified seasonality, unspecified trigger - still has PND; overall some improvement with course of steroids  Mild asthma without complication, unspecified whether persistent - cough has improved with course of steroids  He has spoken with many doctor friends, pharmacists. He got a CT angio ordered for today, which negates the need for D-dimer to be done. (Had discussed that if he had come, I would have done CBC, D-dimer, and referred to pulm for full PFT evaluation,  and if normal spirometry with low DLCO, would have gone down the current path; advised that with no issues with exercise, no tachycardia, doubt PE).  He is already scheduled for test, plans to get, so no need for labs at the moment. He thinks he will be relieved if that is normal, and has been reassured about his heart.  Discussed CT coronary scan--good idea given his family history. He is already on statin. We discussed the overlap with today's study (chest CT), and possibility of any incidental findings.  He is interested in the calcium score, but due to the overlap, and that he is on statin and cardiac etiology not suspected for his current symptoms, can hold off.  He prefers to discuss this again at his visit in November.

## 2022-12-29 ENCOUNTER — Encounter: Payer: Self-pay | Admitting: Family Medicine

## 2022-12-29 ENCOUNTER — Ambulatory Visit (INDEPENDENT_AMBULATORY_CARE_PROVIDER_SITE_OTHER): Payer: Self-pay | Admitting: Family Medicine

## 2022-12-29 VITALS — BP 110/80 | HR 72 | Ht 73.0 in | Wt 167.0 lb

## 2022-12-29 DIAGNOSIS — Z202 Contact with and (suspected) exposure to infections with a predominantly sexual mode of transmission: Secondary | ICD-10-CM

## 2022-12-29 DIAGNOSIS — Z630 Problems in relationship with spouse or partner: Secondary | ICD-10-CM

## 2022-12-29 MED ORDER — METRONIDAZOLE 500 MG PO TABS
2000.0000 mg | ORAL_TABLET | Freq: Once | ORAL | 0 refills | Status: AC
Start: 2022-12-29 — End: 2022-12-29

## 2022-12-29 NOTE — Progress Notes (Signed)
Chief Complaint  Patient presents with   Consult    STD check, wife came back positive for trich.    Patient presents for STD testing.  He was just told yesterday that his wife was diagnosed with trichomonas, and that she may want a divorce/separation. She denies infidelity, and patient also denies infidelity. He did a lot of research, wonders how long they could have had it without symptoms, if perhaps one of them could have picked it up with traveling (wet towel, picked up wrong bathrobe at a spa, etc).  He is very upset/anxious today, as he wasn't expecting separation/divorce. He felt like things haven't been great/normal, but he has asked her repeatedly and she stated things were fine. He feels like this is coming out of left field--she has been thinking about it for a while, but he needs time to process this before just starting divorce proceedings--especially if neither have been unfaithful.  She had reported being unhappy for a while.  Of note, he also reports that he had COVID last month after [employer] concert. At one point he felt he might have a sinus infection. He admits to self-medicating with daughter's doxycyline (rather than calling the [facility], who always gives amox or augmentin).  After taking the doxy, he had gotten sick, with vomiting, and diarrhea. Vomiting started on day 10 of his COVID illness.  Due to taking antibiotic, he was worried about C.diff.  Stool studies were done, and he was diagnosed with norovirus (no C.diff).  PMH, PSH, SH reviewed  Outpatient Encounter Medications as of 12/29/2022  Medication Sig Note   atorvastatin (LIPITOR) 10 MG tablet Take 1 tablet (10 mg total) by mouth daily.    cetirizine (ZYRTEC) 10 MG tablet Take 10 mg by mouth daily.    Coenzyme Q10 300 MG CAPS Take 600 mg by mouth as needed. 12/29/2022: Takes daily (gets myalgias if he doesn't)   metroNIDAZOLE (FLAGYL) 500 MG tablet Take 4 tablets (2,000 mg total) by mouth once for 1 dose.     montelukast (SINGULAIR) 10 MG tablet Take 1 tablet (10 mg total) by mouth at bedtime.    NONFORMULARY OR COMPOUNDED ITEM 1.5 mLs daily. 1.49ml of medication to of saline in saline irrigation bottle:irrigate sinuses with through each nostril daily 07/05/2022: Mometasone 1.2mg /ml   Zinc 30 MG CAPS Take 1 capsule by mouth daily.    [DISCONTINUED] fexofenadine (ALLEGRA) 180 MG tablet Take 180 mg by mouth daily.    albuterol (VENTOLIN HFA) 108 (90 Base) MCG/ACT inhaler Inhale 2 puffs into the lungs every 6 (six) hours as needed for wheezing or shortness of breath. (Patient not taking: Reported on 12/29/2022) 12/29/2022: As needed   ALPRAZolam (XANAX) 0.5 MG tablet Take 0.5-1 tablets (0.25-0.5 mg total) by mouth 3 (three) times daily as needed for anxiety. (Patient not taking: Reported on 12/29/2022) 12/29/2022: As needed   fluticasone (FLOVENT HFA) 110 MCG/ACT inhaler INHALE ONE PUFF BY MOUTH INTO THE LUNGS TWICE DAILY (Patient not taking: Reported on 12/29/2022) 12/29/2022: Uses during allergy season   [DISCONTINUED] guaiFENesin (MUCINEX PO) Take 1 tablet by mouth every 4 (four) hours.    [DISCONTINUED] methocarbamol (ROBAXIN) 500 MG tablet Take 1 tablet (500 mg total) by mouth every 6 (six) hours as needed for muscle spasms. (Patient not taking: Reported on 11/17/2022)    [DISCONTINUED] methylPREDNISolone (MEDROL DOSEPAK) 4 MG TBPK tablet Take as directed 11/17/2022: Took last one today   [DISCONTINUED] pseudoephedrine (SUDAFED) 30 MG tablet Take 30 mg by  mouth every 4 (four) hours as needed for congestion.    No facility-administered encounter medications on file as of 12/29/2022.   NOT taking flagyl prior to today's visit)  Allergies  Allergen Reactions   Cashews [Nuts] Other (See Comments)    thrush   ROS: No recent n/v/d, denies abdominal pain. No f/c. No dysuria, penile discharge or rashes. No further breathing issues or chest pain.  +anxiety related to marital issues    PHYSICAL  EXAM:  BP 110/80   Pulse 72   Ht 6\' 1"  (1.854 m)   Wt 167 lb (75.8 kg)   BMI 22.03 kg/m   Anxious male, at one point on the verge of tears. Normal eye contact, speech, hygiene and grooming. Full range of affect. Exam not performed    ASSESSMENT/PLAN:  Exposure to trichomonas - answered questions as best I could. Treat as sexual contact of wife testing +for trich. Donates blood regularly - Plan: metroNIDAZOLE (FLAGYL) 500 MG tablet, Chlamydia/Gonococcus/Trichomonas, NAA, CANCELED: STI Profile, CT/NG/TV  Marital problem - encouraged counseling--consider individual as well   Counseling--individual and/or couples

## 2022-12-30 ENCOUNTER — Encounter: Payer: Self-pay | Admitting: Family Medicine

## 2022-12-30 LAB — CHLAMYDIA/GONOCOCCUS/TRICHOMONAS, NAA
Chlamydia by NAA: NEGATIVE
Gonococcus by NAA: NEGATIVE
Trich vag by NAA: NEGATIVE

## 2023-03-13 IMAGING — US US SCROTUM W/ DOPPLER COMPLETE
1 series · 14 of 25 positions shown · non-contrast
Comparison: None.

CLINICAL DATA: Nodule right testicle discomfort left scrotum

EXAM:
SCROTAL ULTRASOUND
DOPPLER ULTRASOUND OF THE TESTICLES
TECHNIQUE: Complete ultrasound examination of the testicles, epididymis, and
other scrotal structures was performed. Color and spectral Doppler
ultrasound were also utilized to evaluate blood flow to the
testicles.

[Series 1: us scrotum w/ doppler complete · 14 of 93 slices shown]
[im 1/93]
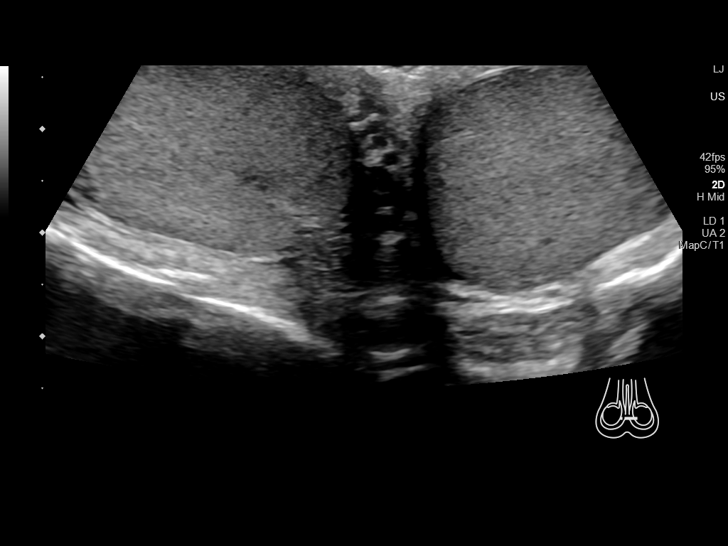
[im 8/93]
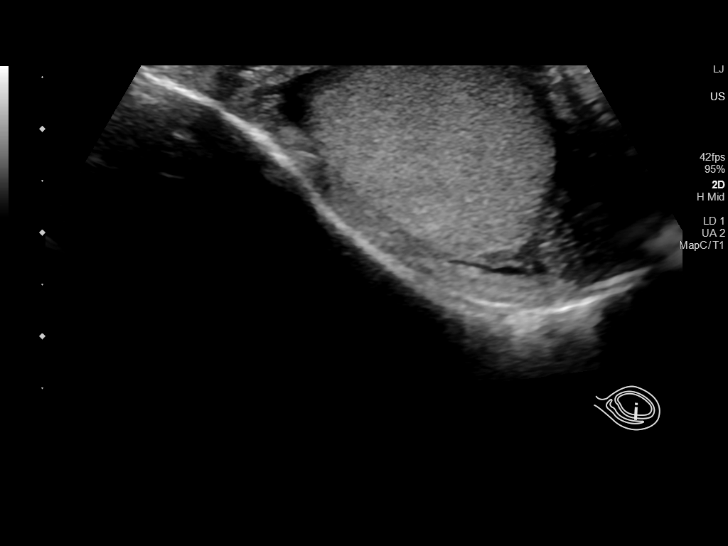
[im 16/93]
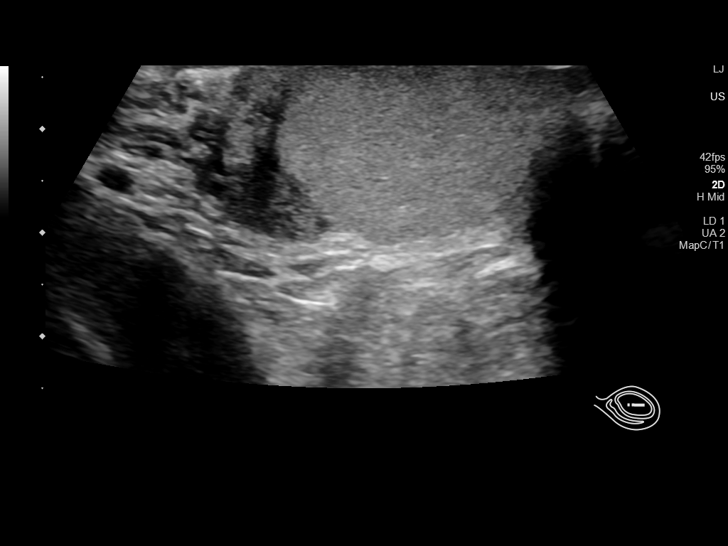
[im 24/93]
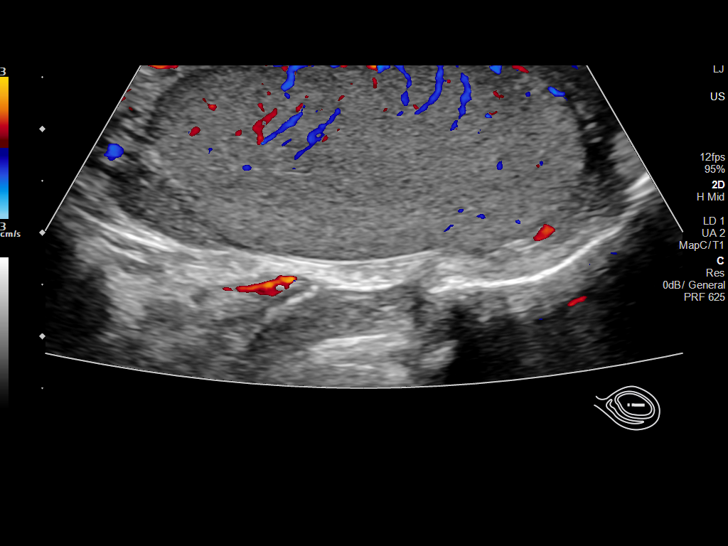
[im 31/93]
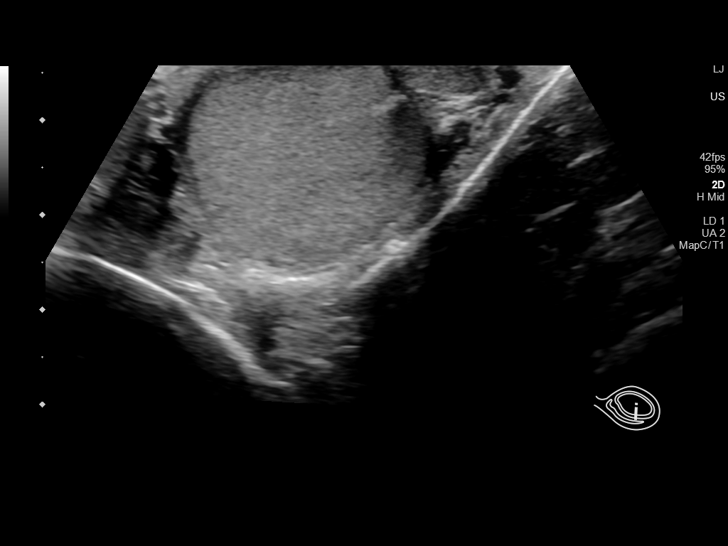
[im 35/93]
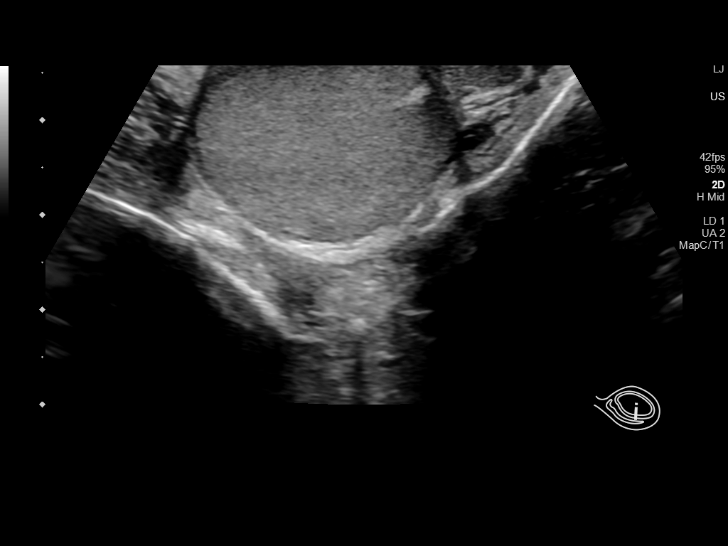
[im 43/93]
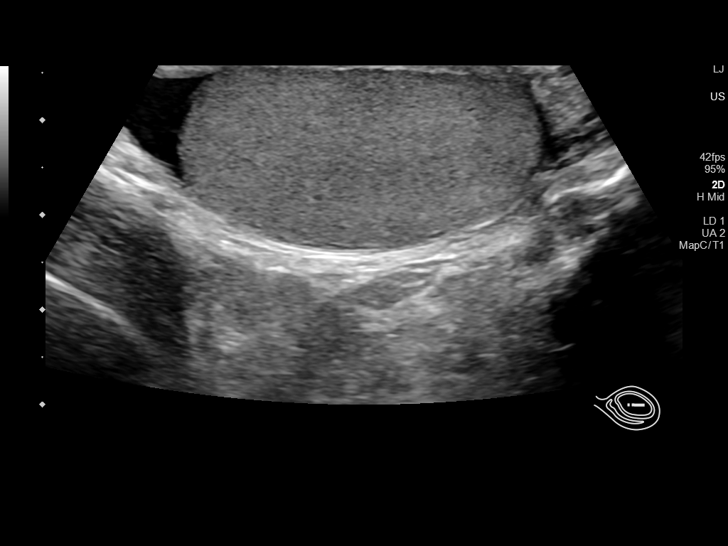
[im 50/93]
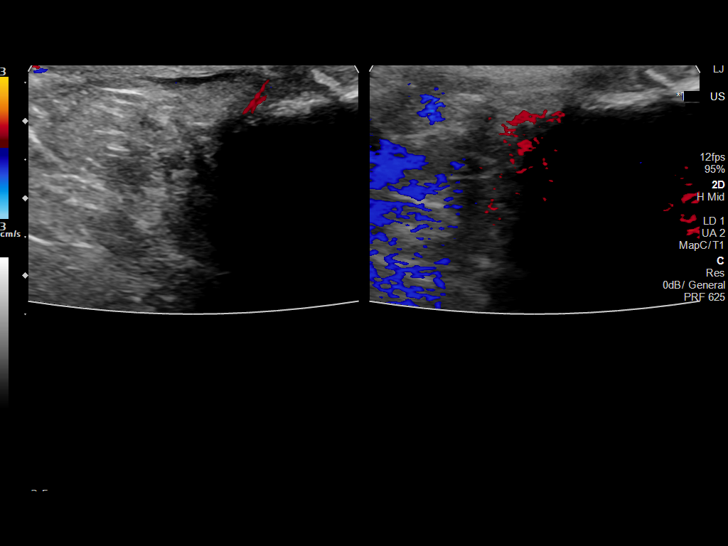
[im 58/93]
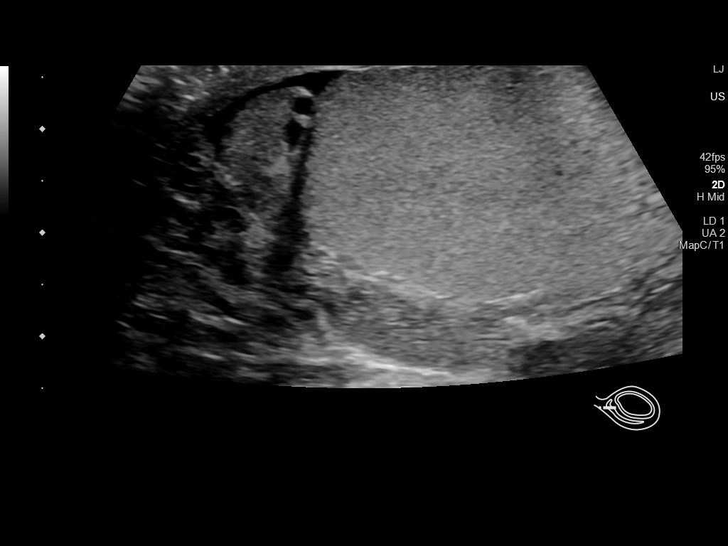
[im 62/93]
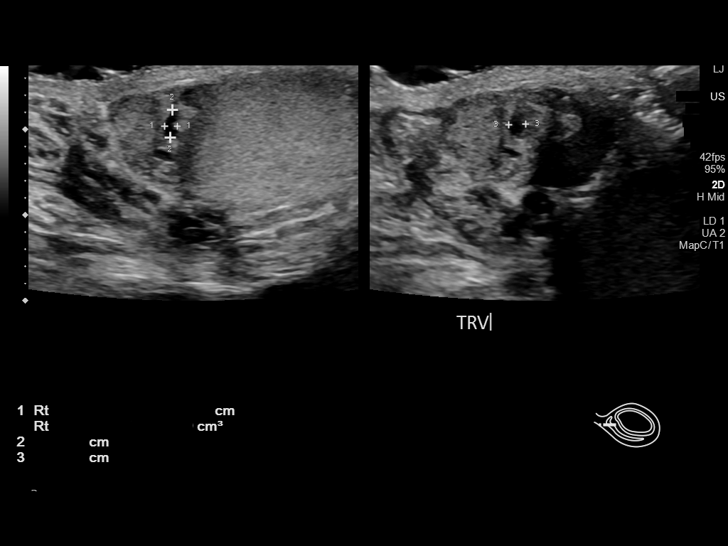
[im 70/93]
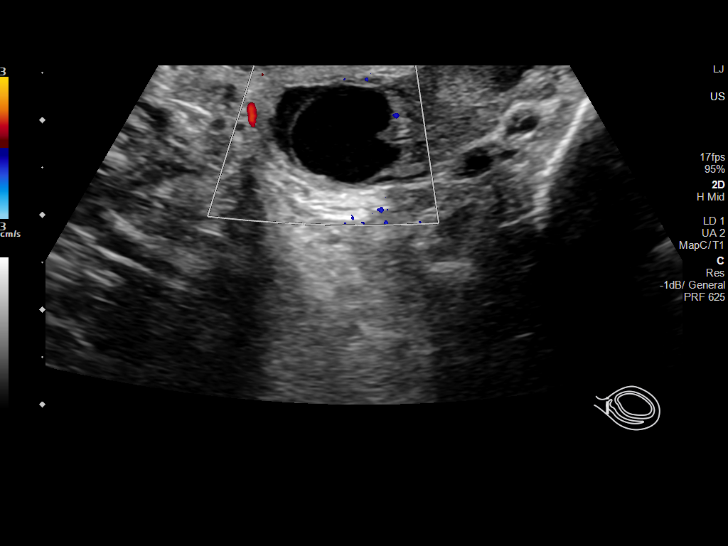
[im 77/93]
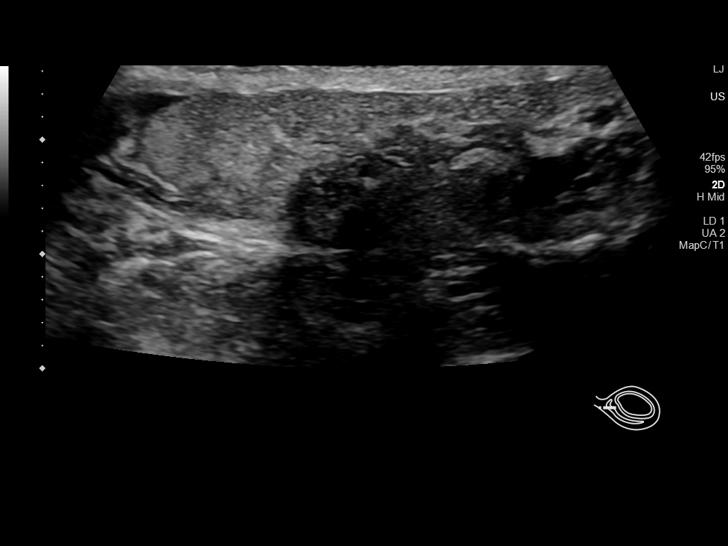
[im 85/93]
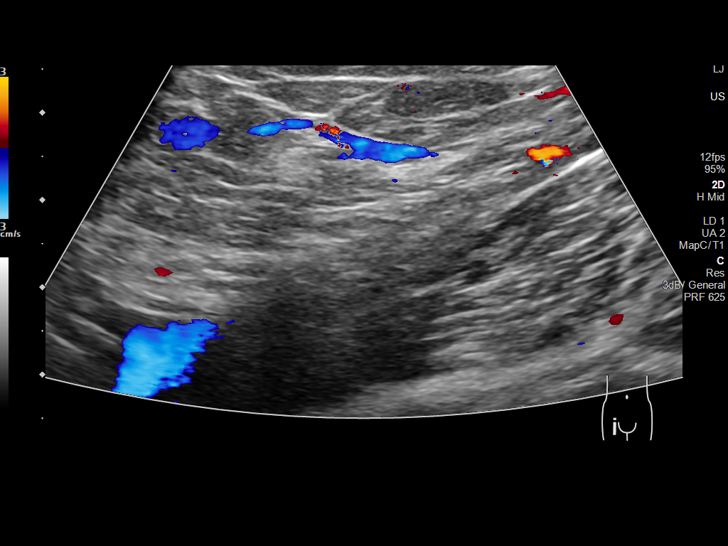
[im 93/93]
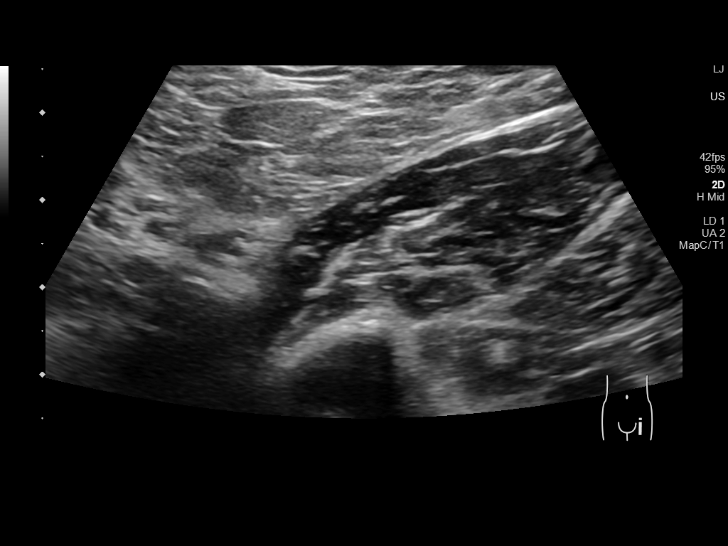

[14 of 25 positions shown; findings below may reference images not displayed]

FINDINGS: Right testicle

Measurements: 4.2 x 2 x 3.1 cm. No mass or microlithiasis
visualized.

Left testicle

Measurements: 4 x 2 x 2.8 cm. No mass or microlithiasis visualized.

Right epididymis:  Small cysts measuring up to 3 mm.

Left epididymis:  Small cyst measuring up to 10 mm.

Hydrocele:  Small bilateral hydroceles

Varicocele:  None visualized.

Pulsed Doppler interrogation of both testes demonstrates normal low
resistance arterial and venous waveforms bilaterally.
IMPRESSION: 1. Negative for acute testicular torsion or intratesticular mass.
2. Small epididymal cysts
3. Trace bilateral hydroceles

## 2023-05-09 ENCOUNTER — Encounter: Payer: Self-pay | Admitting: Family Medicine

## 2023-05-09 ENCOUNTER — Ambulatory Visit (INDEPENDENT_AMBULATORY_CARE_PROVIDER_SITE_OTHER): Payer: Self-pay | Admitting: Family Medicine

## 2023-05-09 VITALS — BP 120/70 | HR 80 | Temp 98.4°F | Ht 73.0 in | Wt 170.6 lb

## 2023-05-09 DIAGNOSIS — J019 Acute sinusitis, unspecified: Secondary | ICD-10-CM

## 2023-05-09 DIAGNOSIS — R519 Headache, unspecified: Secondary | ICD-10-CM

## 2023-05-09 MED ORDER — DOXYCYCLINE HYCLATE 100 MG PO TABS
100.0000 mg | ORAL_TABLET | Freq: Two times a day (BID) | ORAL | 0 refills | Status: DC
Start: 2023-05-09 — End: 2023-07-25

## 2023-05-09 NOTE — Progress Notes (Signed)
Chief Complaint  Patient presents with   Sinusitis   Facial Pain    Sinus pain and pressure x 4 weeks. Did telemed through insurance 9/24 and was given 7 days of augmentin 875. Was not given anything else. Felt a little better, but not 100%. Still has a HA, heavy eyes and can't sleep-achy.    2 weeks ago, he had "regular sinus pain and pressure that wouldn't go away". Had been sick for 2 weeks prior to 9/24 telehealth visit.  Never had discolored mucus. He was treated with 7d of Augmentin, and a steroid taper (started a few days later) through telehealth through work. He had some improvement, but has persistent pressure in his frontal sinuses. He states he felt better for only 4-5 days, then the frontal pressure recurred. No abnormal drainage from when he does mometasone sinus rinses. Eyes feel tired and achey at the end of the day, neck is tired. Pressing on the area above his eyes (upper orbit) helps some. No pressure at his cheeks. Slight discomfort at temples, and neck, but mostly around his eyes.  Last eye exam was ages ago, only once or twice in the last decade.  Has an appointment on Thursday with his eye doctor.   He has been taking NSAID, sudafed, saline with compounded mometasons rinse, increases to BID when feeling worse (usually just does it once daily). Advil or sudafed both help some.    PMH, PSH, SH reviewed  Outpatient Encounter Medications as of 05/09/2023  Medication Sig Note   atorvastatin (LIPITOR) 10 MG tablet Take 1 tablet (10 mg total) by mouth daily.    cetirizine (ZYRTEC) 10 MG tablet Take 10 mg by mouth daily.    Coenzyme Q10 300 MG CAPS Take 600 mg by mouth as needed. 12/29/2022: Takes daily (gets myalgias if he doesn't)   ibuprofen (ADVIL) 200 MG tablet Take 600 mg by mouth every 6 (six) hours as needed. 05/09/2023: Last dose yesterday   montelukast (SINGULAIR) 10 MG tablet Take 1 tablet (10 mg total) by mouth at bedtime.    NONFORMULARY OR COMPOUNDED ITEM 1.5  mLs daily. 1.44ml of medication to of saline in saline irrigation bottle:irrigate sinuses with through each nostril daily 07/05/2022: Mometasone 1.2mg /ml   pseudoephedrine (SUDAFED) 30 MG tablet Take 30 mg by mouth every 4 (four) hours as needed for congestion. 05/09/2023: Last dose yesterday   Zinc 30 MG CAPS Take 1 capsule by mouth daily.    albuterol (VENTOLIN HFA) 108 (90 Base) MCG/ACT inhaler Inhale 2 puffs into the lungs every 6 (six) hours as needed for wheezing or shortness of breath. (Patient not taking: Reported on 12/29/2022) 05/09/2023: As needed   ALPRAZolam (XANAX) 0.5 MG tablet Take 0.5-1 tablets (0.25-0.5 mg total) by mouth 3 (three) times daily as needed for anxiety. (Patient not taking: Reported on 12/29/2022) 05/09/2023: As needed   fluticasone (FLOVENT HFA) 110 MCG/ACT inhaler INHALE ONE PUFF BY MOUTH INTO THE LUNGS TWICE DAILY (Patient not taking: Reported on 12/29/2022) 05/09/2023: During allergy season   No facility-administered encounter medications on file as of 05/09/2023.   Allergies  Allergen Reactions   Cashews [Nuts] Other (See Comments)    thrush    ROS: no f/c. No ST or cough. No n/v/d No sick contacts No chest pain or shortness of breath. See HPI    PHYSICAL EXAM:  BP 120/70   Pulse 80   Temp 98.4 F (36.9 C) (Tympanic)   Ht 6\' 1"  (1.854 m)  Wt 170 lb 9.6 oz (77.4 kg)   BMI 22.51 kg/m    Well-appearing, pleasant male, frequently rubbing at upper brows/orbits. HEENT: conjunctiva and sclera are clear, EOMI. TM's and EAC's normal bilaterally. Nasal mucosa--normal on the right. Sme inflammation and crusting noted deeper in L nares. Mildly tender over frontal sinuses bilaterally, nontender over maxillary sinuses. OP clear Neck: no lymphadenopathy or mass Heart: regular rate and rhythm Lungs: clear bilaterally Neuro: alert and oriented, cranial nerves grossly intact, normal gait Psych: normal mood, affect, hygiene and  grooming    ASSESSMENT/PLAN:  Subacute sinusitis, unspecified location - switch ABX to doxy. Cont sinus rinses/steroids. Mucinex BID - Plan: doxycycline (VIBRA-TABS) 100 MG tablet  Frontal headache - possible ongoing sinus infection; agree with eye evaluation to r/o issues from eye strain/vision.  Take the doxycycline twice daily for 10 days. Continue the sinus rinses twice daily. Add in Mucinex/guaifenesin regularly. See the eye doctor as planned. Let's avoid more steroids since there isn't that much edema in your mucus membranes and symptoms aren't too severe.

## 2023-05-09 NOTE — Patient Instructions (Signed)
Take the doxycycline twice daily for 10 days. Continue the sinus rinses twice daily. Add in Mucinex/guaifenesin regularly. See the eye doctor as planned. Let's avoid more steroids since there isn't that much edema in your mucus membranes and symptoms aren't too severe.

## 2023-06-06 ENCOUNTER — Ambulatory Visit (INDEPENDENT_AMBULATORY_CARE_PROVIDER_SITE_OTHER): Payer: Self-pay

## 2023-06-06 ENCOUNTER — Encounter: Payer: Self-pay | Admitting: Podiatry

## 2023-06-06 ENCOUNTER — Ambulatory Visit (INDEPENDENT_AMBULATORY_CARE_PROVIDER_SITE_OTHER): Payer: Self-pay | Admitting: Podiatry

## 2023-06-06 DIAGNOSIS — M79672 Pain in left foot: Secondary | ICD-10-CM

## 2023-06-06 DIAGNOSIS — M7732 Calcaneal spur, left foot: Secondary | ICD-10-CM

## 2023-06-06 DIAGNOSIS — M722 Plantar fascial fibromatosis: Secondary | ICD-10-CM

## 2023-06-06 MED ORDER — MELOXICAM 15 MG PO TABS: 15.0000 mg | ORAL_TABLET | Freq: Every day | ORAL | 0 refills | Status: DC | PRN

## 2023-06-06 NOTE — Patient Instructions (Signed)

## 2023-06-06 NOTE — Progress Notes (Unsigned)
Subjective:   Patient ID: Kevin Chan, male   DOB: 47 y.o.   MRN: [id number]   HPI Chief Complaint  Patient presents with   Plantar Fasciitis    Left foot pain off and on for years all of a sudden has set in.   Male presents the office today for left foot pain, plantar fasciitis.  Is been dealing this issue for quite some time however over the last 6 months has been worsening.  He plays golf very regularly.  Some days it hurts and some days he does not want to play.  Does not report any recent injuries.  No swelling.  Has been trying stretching which helps some as well as Powersteps which seem to help, but the pain has not gone away.  No numbness or tingling or any radiating pain.   Review of Systems  All other systems reviewed and are negative.  Past Medical History:  Diagnosis Date   Allergic rhinitis, cause unspecified    completed immunotherapy through [facility]   Allergy    Asthma    Hemorrhoid    internal   Hyperlipidemia    previously had myalgias on Crestor and simvastatin   Mitral valve prolapse    Staph infection    Trigger finger    right 3rd and 4th fingers; s/p injections (Dr. Merlyn Lot)    Past Surgical History:  Procedure Laterality Date   EXTERNAL EAR SURGERY Right 09/2021   removal of skin lesion   EYE SURGERY     Lasik   NASAL SINUS SURGERY  09/01/2010   [place]   SEPTOPLASTY  11/2009   turbinate reduction (Dr. Lazarus Salines)   TRIGGER FINGER RELEASE Right 10/30/2019   R 3rd and 4th finger   VASECTOMY  08/2013   Dr. Retta Diones     Current Outpatient Medications:    albuterol (VENTOLIN HFA) 108 (90 Base) MCG/ACT inhaler, Inhale 2 puffs into the lungs every 6 (six) hours as needed for wheezing or shortness of breath., Disp: 1 each, Rfl: 0   ALPRAZolam (XANAX) 0.5 MG tablet, Take 0.5-1 tablets (0.25-0.5 mg total) by mouth 3 (three) times daily as needed for anxiety., Disp: 10 tablet, Rfl: 0   atorvastatin (LIPITOR) 10 MG tablet, Take 1 tablet (10 mg  total) by mouth daily., Disp: 90 tablet, Rfl: 3   cetirizine (ZYRTEC) 10 MG tablet, Take 10 mg by mouth daily., Disp: , Rfl:    Coenzyme Q10 300 MG CAPS, Take 600 mg by mouth as needed., Disp: , Rfl:    doxycycline (VIBRA-TABS) 100 MG tablet, Take 1 tablet (100 mg total) by mouth 2 (two) times daily., Disp: 20 tablet, Rfl: 0   fluticasone (FLOVENT HFA) 110 MCG/ACT inhaler, INHALE ONE PUFF BY MOUTH INTO THE LUNGS TWICE DAILY, Disp: 12 g, Rfl: 1   meloxicam (MOBIC) 15 MG tablet, Take 1 tablet (15 mg total) by mouth daily as needed for pain., Disp: 30 tablet, Rfl: 0   montelukast (SINGULAIR) 10 MG tablet, Take 1 tablet (10 mg total) by mouth at bedtime., Disp: 90 tablet, Rfl: 3   NONFORMULARY OR COMPOUNDED ITEM, 1.5 mLs daily. 1.84ml of medication to of saline in saline irrigation bottle:irrigate sinuses with through each nostril daily, Disp: , Rfl:    pseudoephedrine (SUDAFED) 30 MG tablet, Take 30 mg by mouth every 4 (four) hours as needed for congestion., Disp: , Rfl:    Zinc 30 MG CAPS, Take 1 capsule by mouth daily., Disp: , Rfl:  Allergies  Allergen Reactions   Cashews [Nuts] Other (See Comments)    thrush          Objective:  Physical Exam  General: AAO x3, NAD  Dermatological: Skin is warm, dry and supple bilateral.  There are no open sores, no preulcerative lesions, no rash or signs of infection present.  Vascular: Dorsalis Pedis artery and Posterior Tibial artery pedal pulses are 2/4 bilateral with immedate capillary fill time.  There is no pain with calf compression, swelling, warmth, erythema.   Neruologic: Grossly intact via light touch bilateral.  Negative Tinel sign.  Musculoskeletal:  Tenderness to palpation along the plantar medial tubercle of the calcaneus at the insertion of plantar fascia on the left foot. There is no pain along the course of the plantar fascia within the arch of the foot. Plantar fascia appears to be intact. There is no pain with lateral  compression of the calcaneus or pain with vibratory sensation. There is no pain along the course or insertion of the achilles tendon. No other areas of tenderness to bilateral lower extremities.  Gait: Unassisted, Nonantalgic.       Assessment:   47 year old male with plantar fasciitis, heel spur left     Plan:  -Treatment options discussed including all alternatives, risks, and complications -Etiology of symptoms were discussed -X-rays were obtained and reviewed with the patient.  3 views left foot were obtained.  There is no evidence of acute fracture.  Calcaneal spurring is present. -We discussed injection was to proceed with this.  See procedure note below.  Verbal consent obtained -Discussed resting, icing on a regular basis- -Prescribed mobic. Discussed side effects of the medication and directed to stop if any are to occur and call the office.  -Continue shoes, good support.  Consider custom orthotics  Procedure: Injection Tendon/Ligament Discussed alternatives, risks, complications and verbal consent was obtained.  Location: Left plantar fascia at the glabrous junction; medial approach. Skin Prep: Alcohol. Injectate: 0.5cc 0.5% marcaine plain, 0.5 cc 2% lidocaine plain and, 1 cc kenalog 10. Disposition: Patient tolerated procedure well. Injection site dressed with a band-aid.  Post-injection care was discussed and return precautions discussed.    No follow-ups on file.  Vivi Barrack DPM

## 2023-07-22 NOTE — Patient Instructions (Incomplete)

## 2023-07-22 NOTE — Progress Notes (Unsigned)
No chief complaint on file.  Kevin Chan is a 47 y.o. male who presents for a complete physical.    He saw podiatrist last month for L PF. Was given a cortisone injection and prescribed Mobic. Went to UC late October and treated with Cefdinir for R OM. Seen here 10/7 and treated with doxy for sinus infection (had gotten 7d of Augmentin from telehealth prior to that).  Seen here in May for STD testing and discussion of marital issues, after wife had +test for trichomonas.  ***   Hyperlipidemia: taking lipitor 10 mg daily; requires daily coenzyme q10 to avoid the myalgias.  He hasn't tried without the CoQ10 in many years (he didn't tolerate Crestor in the past). Continues on same diet as last year --less carbs, more protein (including beef 2-3x/week, infrequent eggs). Lab Results  Component Value Date   CHOL 166 07/05/2022   HDL 55 07/05/2022   LDLCALC 93 07/05/2022   TRIG 97 07/05/2022   CHOLHDL 3.0 07/05/2022   He donates blood regularly (double red), three times/year, last time was in ***    Asthma: Has been doing well. Asthma is controlled with singulair daily. He used to use Flovent HFA once daily, seasonally (spring/fall), and would increase it to BID when sick. Last year he reported no longer using Flovent regularly, just using albuterol prn with illness.  UPDATE ***  Allergic rhinitis-- He continues on singulair daily. He no longer uses flonase.  He has been doing daily sinus saline rinses, along with triple dose mometasone rinse.  He is taking Allegra daily, and has Astelin to use if needed during allergy season.  Insomnia--he has spurts of insomnia, where he has trouble "turning his brain off" at night, especially if he wakes up at 3am, thinks of all the things he needs to do.  This isn't frequent, and for the mostpart, sleeps pretty well. He has used Ambien in the past, last for his flight to [place]. UPDATE ***   History of internal hemorrhoids, with h/o bleeding  and itching. He was prescribed Anusol HC suppositories, which he uses prn flares.     Immunization History  Administered Date(s) Administered   Influenza Split 05/30/2009, 05/27/2010, 05/12/2011   Influenza, Seasonal, Injecte, Preservative Fre 05/13/2014   Influenza,inj,Quad PF,6+ Mos 04/21/2015, 05/17/2016, 05/30/2018   Influenza,inj,quad, With Preservative 05/19/2020   Influenza-Unspecified 04/21/2015, 05/18/2019, 05/15/2021, 06/28/2022   Moderna Sars-Covid-2 Vaccination 10/15/2019, 11/12/2019, 07/05/2020, 05/15/2021   Pneumococcal Polysaccharide-23 02/19/2016   Tdap 01/13/2009, 03/01/2018  Has gotten flu at pharmacy, declines another COVID booster Last colonoscopy: 06/17/22 Dr. Dulce Sellar, recheck 5 years Last PSA: never   Dentist: twice yearly   Ophtho: every 2-3 years Exercise:   gym  2-3x/week, 30 mins of elliptical and weight-bearing exercise. Skiing in the winter and golfs (walks the course more half the time). Yoga once weekly.   PMH, PSH, SH and FH were reviewed and updated.     ROS: The patient denies anorexia, fever, headaches, vision loss, decreased hearing, ear pain, hoarseness, chest pain, palpitations, dizziness, syncope, dyspnea on exertion, cough, swelling, nausea, vomiting, diarrhea, constipation, abdominal pain, melena, hematochezia, indigestion/heartburn, hematuria, incontinence, erectile dysfunction, nocturia, weakened urine stream, dysuria, genital lesions, joint pains, numbness, tingling, weakness, tremor, suspicious skin lesions, depression, anxiety, abnormal bleeding/bruising, or enlarged lymph nodes  Some triggering of R pinkie, not bothersome Allergies and asthma are controlled with his current regimen. Internal hemorrhoids aren't flaring. Intermittent insomnia (terminal) just sporadically per HPI    PHYSICAL EXAM:  There were  no vitals taken for this visit.  Wt Readings from Last 3 Encounters:  05/09/23 170 lb 9.6 oz (77.4 kg)  12/29/22 167 lb  (75.8 kg)  11/17/22 166 lb 12.8 oz (75.7 kg)    General Appearance:   Alert, cooperative, no distress, appears stated age   Head:    Normocephalic, without obvious abnormality, atraumatic     Eyes:    PERRL, conjunctiva/corneas clear, EOM's intact, fundi benign    Ears:    Normal TM's and external ear canals     Nose:    No drainage, sinuses nontender  Throat:    Normal mucosa  Neck:    Supple, no lymphadenopathy; thyroid: no enlargement/ tenderness/nodules; no carotid bruit or JVD     Back:    Spine nontender, no curvature, ROM normal, no CVA tenderness     Lungs:    Clear to auscultation bilaterally without wheezes, rales or ronchi; respirations unlabored     Chest Wall:    No tenderness or deformity     Heart:    Regular rate and rhythm, S1 and S2 normal, no murmur, rub   or gallop     Breast Exam:    No chest wall tenderness, masses or gynecomastia     Abdomen:    Soft, non-tender, nondistended, normoactive bowel sounds,   no masses, no hepatosplenomegaly     Genitalia:    Normal male external genitalia without lesions. Circumcised. Testicles without masses. 1cm nodule felt superior/posterior to the left testicle, nontender, unchanged.  No inguinal hernias.   Rectal:    Normal sphincter tone, no masses.  Prostate is not enlarged, no nodules or masses. Guaiac negative stool  Extremities:    No clubbing, cyanosis or edema. Triggering of R pinkie    Pulses:    2+ and symmetric all extremities     Skin:    Skin color, texture, turgor normal, no rashes or lesions.   Lymph nodes:    Cervical, supraclavicular, and inguinal nodes normal     Neurologic:    Normal strength, sensation and gait; reflexes 2+ and symmetric throughout                              Psych:   Normal mood, affect, hygiene and grooming   ***UPDATE scrotum--sup/post to L test Triggering of pinkie?   ASSESSMENT/PLAN:  Flu, COVID (Usually gets flu shot from pharmacy, find date; last year declined COVID d/t insurance  not covering)  Need flovent or albuterol refills?  Cbc, c-met, lipids  Recommended at least 30 minutes of aerobic activity at least 5 days/week, weight-bearing exercise at least 2x/week; proper sunscreen use reviewed; healthy diet and alcohol recommendations (less than or equal to 2 drinks/day) reviewed; regular seatbelt use; changing batteries in smoke detectors.  Immunization recommendations discussed--continue flu shots yearly.  Declined COVID booster (doesn't have insurance coverage).*** Colonoscopy recommendations reviewed, UTD.   F/u 1 year, sooner prn

## 2023-07-25 ENCOUNTER — Encounter: Payer: Self-pay | Admitting: Family Medicine

## 2023-07-25 ENCOUNTER — Ambulatory Visit (INDEPENDENT_AMBULATORY_CARE_PROVIDER_SITE_OTHER): Payer: Self-pay | Admitting: Family Medicine

## 2023-07-25 VITALS — BP 102/70 | HR 56 | Ht 72.0 in | Wt 170.2 lb

## 2023-07-25 DIAGNOSIS — J309 Allergic rhinitis, unspecified: Secondary | ICD-10-CM

## 2023-07-25 DIAGNOSIS — Z0000 Encounter for general adult medical examination without abnormal findings: Secondary | ICD-10-CM

## 2023-07-25 DIAGNOSIS — J45909 Unspecified asthma, uncomplicated: Secondary | ICD-10-CM

## 2023-07-25 DIAGNOSIS — E7800 Pure hypercholesterolemia, unspecified: Secondary | ICD-10-CM

## 2023-07-25 LAB — LIPID PANEL

## 2023-07-25 MED ORDER — MONTELUKAST SODIUM 10 MG PO TABS: 10.0000 mg | ORAL_TABLET | Freq: Every day | ORAL | 3 refills | Status: DC

## 2023-07-25 MED ORDER — ATORVASTATIN CALCIUM 10 MG PO TABS: 10.0000 mg | ORAL_TABLET | Freq: Every day | ORAL | 3 refills | Status: DC

## 2023-07-26 LAB — LIPID PANEL
Cholesterol, Total: 193 mg/dL (ref 100–199)
HDL: 70 mg/dL (ref >39)
LDL CALC COMMENT:: 2.8 ratio (ref 0.0–5.0)
LDL Chol Calc (NIH): 104 mg/dL — ABNORMAL HIGH (ref 0–99)
Triglycerides: 109 mg/dL (ref 0–149)
VLDL Cholesterol Cal: 19 mg/dL (ref 5–40)

## 2023-07-26 LAB — CMP14+EGFR
ALT: 25 IU/L (ref 0–44)
AST: 16 IU/L (ref 0–40)
Albumin: 4.4 g/dL (ref 4.1–5.1)
Alkaline Phosphatase: 79 IU/L (ref 44–121)
BUN/Creatinine Ratio: 26 — ABNORMAL HIGH (ref 9–20)
BUN: 25 mg/dL — ABNORMAL HIGH (ref 6–24)
Bilirubin Total: 0.4 mg/dL (ref 0.0–1.2)
CO2: 21 mmol/L (ref 20–29)
Calcium: 9.5 mg/dL (ref 8.7–10.2)
Chloride: 101 mmol/L (ref 96–106)
Creatinine, Ser: 0.96 mg/dL (ref 0.76–1.27)
Globulin, Total: 2.6 g/dL (ref 1.5–4.5)
Glucose: 88 mg/dL (ref 70–99)
Potassium: 4.5 mmol/L (ref 3.5–5.2)
Sodium: 139 mmol/L (ref 134–144)
Total Protein: 7 g/dL (ref 6.0–8.5)
eGFR: 99 mL/min/{1.73_m2} (ref >59)

## 2023-07-26 LAB — CBC WITH DIFFERENTIAL/PLATELET
Basophils Absolute: 0 10*3/uL (ref 0.0–0.2)
Basos: 1 %
EOS (ABSOLUTE): 0.4 10*3/uL (ref 0.0–0.4)
Eos: 6 %
Hematocrit: 46 % (ref 37.5–51.0)
Hemoglobin: 15.7 g/dL (ref 13.0–17.7)
Immature Grans (Abs): 0 10*3/uL (ref 0.0–0.1)
Immature Granulocytes: 1 %
Lymphocytes Absolute: 2.1 10*3/uL (ref 0.7–3.1)
Lymphs: 30 %
MCH: 30.1 pg (ref 26.6–33.0)
MCHC: 34.1 g/dL (ref 31.5–35.7)
MCV: 88 fL (ref 79–97)
Monocytes Absolute: 0.5 10*3/uL (ref 0.1–0.9)
Monocytes: 8 %
Neutrophils Absolute: 3.9 10*3/uL (ref 1.4–7.0)
Neutrophils: 54 %
Platelets: 272 10*3/uL (ref 150–450)
RBC: 5.22 x10E6/uL (ref 4.14–5.80)
RDW: 12.4 % (ref 11.6–15.4)
WBC: 7.1 10*3/uL (ref 3.4–10.8)

## 2023-08-23 ENCOUNTER — Ambulatory Visit (INDEPENDENT_AMBULATORY_CARE_PROVIDER_SITE_OTHER): Payer: Self-pay | Admitting: Medical

## 2023-08-23 VITALS — BP 118/80 | HR 110 | Temp 97.2°F | Wt 171.0 lb

## 2023-08-23 DIAGNOSIS — R051 Acute cough: Secondary | ICD-10-CM

## 2023-08-23 DIAGNOSIS — J019 Acute sinusitis, unspecified: Secondary | ICD-10-CM

## 2023-08-23 MED ORDER — AMOXICILLIN-POT CLAVULANATE 875-125 MG PO TABS: 1.0000 | ORAL_TABLET | Freq: Two times a day (BID) | ORAL | 0 refills | Status: DC

## 2023-08-23 MED ORDER — METHYLPREDNISOLONE 4 MG PO TBPK: ORAL_TABLET | ORAL | 0 refills | Status: DC

## 2023-08-23 MED ORDER — HYDROCOD POLI-CHLORPHE POLI ER 10-8 MG/5ML PO SUER: 5.0000 mL | Freq: Two times a day (BID) | ORAL | 0 refills | Status: DC

## 2023-08-23 NOTE — Progress Notes (Signed)
Subjective:  Kevin Chan is a 48 y.o. male who presents for Chief Complaint  Patient presents with   sick    Sick- for over a week, cough, mucous- brownish/ green, taking mucinex for days 6, sudafed, sore neck, fatigue, whole family has been sick. Nothing is helping. Going out of town this weekend for a week     Here for sick issues.  Was around his son who got sick over a week ago, then he and wife both got sick.   In past when he get sick tends to get sinus infection.   Has prior sinus surgery as well.     He has now been sick at least a week.  Symptoms include lots of cough, sinus pressure, headaches, general malaise, poor sleep with congestion in nose and head, soreness in body.  Has been using sudafed, Advil, mucinex, inhaler.   Severity has been 6/10.   Coughing quite a bit at night keeping him up.  He is tired of dealing with all this mass.  He is scheduled to go out of town for a ski trip this weekend and wants to be improving by then  No fever.  No body aches or chills.  Drinking good amount of water.   No other aggravating or relieving factors.    No other c/o.  Past Medical History:  Diagnosis Date   Allergic rhinitis, cause unspecified    completed immunotherapy through [facility]   Allergy    Asthma    Hemorrhoid    internal   Hyperlipidemia    previously had myalgias on Crestor and simvastatin   Mitral valve prolapse    Staph infection    Trigger finger    right 3rd and 4th fingers; s/p injections (Dr. Merlyn Lot)   Current Outpatient Medications on File Prior to Visit  Medication Sig Dispense Refill   albuterol (VENTOLIN HFA) 108 (90 Base) MCG/ACT inhaler Inhale 2 puffs into the lungs every 6 (six) hours as needed for wheezing or shortness of breath. 1 each 0   ALPRAZolam (XANAX) 0.5 MG tablet Take 0.5-1 tablets (0.25-0.5 mg total) by mouth 3 (three) times daily as needed for anxiety. 10 tablet 0   atorvastatin (LIPITOR) 10 MG tablet Take 1 tablet (10 mg total) by  mouth daily. 90 tablet 3   cetirizine (ZYRTEC) 10 MG tablet Take 10 mg by mouth daily.     Coenzyme Q10 300 MG CAPS Take 600 mg by mouth as needed.     fluticasone (FLOVENT HFA) 110 MCG/ACT inhaler INHALE ONE PUFF BY MOUTH INTO THE LUNGS TWICE DAILY 12 g 1   montelukast (SINGULAIR) 10 MG tablet Take 1 tablet (10 mg total) by mouth at bedtime. 90 tablet 3   NONFORMULARY OR COMPOUNDED ITEM 1.5 mLs daily. 1.58ml of medication to of saline in saline irrigation bottle:irrigate sinuses with through each nostril daily     Zinc 30 MG CAPS Take 1 capsule by mouth daily.     No current facility-administered medications on file prior to visit.    The following portions of the patient's history were reviewed and updated as appropriate: allergies, current medications, past family history, past medical history, past social history, past surgical history and problem list.  ROS Otherwise as in subjective above    Objective: BP 118/80   Pulse (!) 110   Temp (!) 97.2 F (36.2 C)   Wt 171 lb (77.6 kg)   SpO2 99%   BMI 23.19 kg/m  General appearance: alert, no distress, well developed, well nourished HEENT: normocephalic, sclerae anicteric, conjunctiva pink and moist, TMs pearly, nares patent, no discharge or erythema, pharynx normal Oral cavity: MMM, no lesions Neck: supple, no lymphadenopathy, no thyromegaly, no masses Heart: RRR, normal S1, S2, no murmurs Lungs: CTA bilaterally, no wheezes, rhonchi, or rales Abdomen: +bs, soft, non tender, non distended, no masses, no hepatomegaly, no splenomegaly Pulses: 2+ radial pulses, 2+ pedal pulses, normal cap refill Ext: no edema    Assessment: Encounter Diagnoses  Name Primary?   Acute non-recurrent sinusitis, unspecified location Yes   Acute cough      Plan: Advise rest, hydration, begin antibiotic below.  Begin steroid Dosepak below.  Can use Tussionex to at night for cough but do not duplicate this with other over-the-counter  cough and congestion remedies within 4 hours of each other.  Discussed risk and benefits of medication  Continue your usual medicines  Advised not to overuse decongestants given that his pulse rate is a little elevated today  If not much improved within the next 4 to 5 days, then let us know  Dicky was seen today for sick.  Diagnoses and all orders for this visit:  Acute non-recurrent sinusitis, unspecified location  Acute cough  Other orders -     amoxicillin-clavulanate (AUGMENTIN) 875-125 MG tablet; Take 1 tablet by mouth 2 (two) times daily. -     methylPREDNISolone (MEDROL DOSEPAK) 4 MG TBPK tablet; 6 tablets day 1, 5 tablets day 2, 4 tablets day 3, 3 tablets day 4, 2 tablets day 5, 1 tablet day 6 -     chlorpheniramine-HYDROcodone (TUSSIONEX) 10-8 MG/5ML; Take 5 mLs by mouth 2 (two) times daily.   Follow up: prn

## 2023-08-25 ENCOUNTER — Ambulatory Visit: Payer: Self-pay | Admitting: Family Medicine

## 2023-11-01 ENCOUNTER — Other Ambulatory Visit: Payer: Self-pay | Admitting: Family Medicine

## 2023-11-01 NOTE — Telephone Encounter (Signed)
 Is this okay to refill?

## 2023-11-03 ENCOUNTER — Other Ambulatory Visit: Payer: Self-pay | Admitting: Medical Genetics

## 2024-06-12 ENCOUNTER — Other Ambulatory Visit: Payer: Self-pay

## 2024-06-12 DIAGNOSIS — Z006 Encounter for examination for normal comparison and control in clinical research program: Secondary | ICD-10-CM

## 2024-06-22 LAB — GENECONNECT MOLECULAR SCREEN: Genetic Analysis Overall Interpretation: NEGATIVE

## 2024-07-31 ENCOUNTER — Encounter: Payer: Self-pay | Admitting: Family Medicine

## 2024-07-31 ENCOUNTER — Ambulatory Visit (INDEPENDENT_AMBULATORY_CARE_PROVIDER_SITE_OTHER): Payer: Self-pay | Admitting: Family Medicine

## 2024-07-31 VITALS — BP 118/72 | HR 72 | Wt 166.0 lb

## 2024-07-31 DIAGNOSIS — R062 Wheezing: Secondary | ICD-10-CM

## 2024-07-31 DIAGNOSIS — J101 Influenza due to other identified influenza virus with other respiratory manifestations: Secondary | ICD-10-CM

## 2024-07-31 DIAGNOSIS — K649 Unspecified hemorrhoids: Secondary | ICD-10-CM

## 2024-07-31 DIAGNOSIS — K644 Residual hemorrhoidal skin tags: Secondary | ICD-10-CM

## 2024-07-31 MED ORDER — METHYLPREDNISOLONE 4 MG PO TBPK: ORAL_TABLET | ORAL | 0 refills | Status: DC

## 2024-07-31 MED ORDER — HYDROCORTISONE 2.5 % EX CREA: TOPICAL_CREAM | Freq: Two times a day (BID) | CUTANEOUS | 0 refills | Status: AC

## 2024-07-31 MED ORDER — PRAMOXINE HCL (PERIANAL) 1 % EX FOAM: 1.0000 | Freq: Three times a day (TID) | CUTANEOUS | 0 refills | Status: DC | PRN

## 2024-07-31 MED ORDER — AZITHROMYCIN 250 MG PO TABS: ORAL_TABLET | ORAL | 0 refills | Status: AC

## 2024-07-31 MED ORDER — HYDROCORTISONE ACETATE 30 MG RE SUPP: 1.0000 | Freq: Every day | RECTAL | 0 refills | Status: DC

## 2024-07-31 NOTE — Progress Notes (Signed)
 "  Name: Kevin Chan   Date of Visit: 07/31/2024   Date of last visit with me: Visit date not found   CHIEF COMPLAINT:  Chief Complaint  Patient presents with   other    Tested positive for flu B on 22 nd or 23 rd, still has some wheezing, rattling, listen to his lungs, leaving on 08/12/23 for ski trip, also has hemorrhoid possible, last two weeks ago, slight pain and irration,  head ache and cough,        HPI:  Discussed the use of AI scribe software for clinical note transcription with the patient, who gave verbal consent to proceed.  History of Present Illness   Kevin Chan is a 48 year old male who presents with flu-like symptoms and concerns about pneumonia.  He contracted the flu from his son, who tested positive for flu A. Initially, he thought he had flu A. But tested positive for B He describes a 'little rattle' in his chest and is concerned about pneumonia. He reports a productive cough that has since become non-productive. Symptoms were worse yesterday but have improved slightly.  He has a history of sinus issues, having undergone two sinus surgeries, including an ethmoidectomy. He uses a compounded mometasone steroid solution for his sinuses. No tenderness over the sinuses is reported, but he experiences headaches. He is familiar with sinus infections and is vigilant about symptoms.  He is experiencing discomfort from an external hemorrhoid, described as 'annoying' and 'tender.' He has a history of internal hemorrhoids, for which he has used suppositories and had banding procedures. Recently, he used hydrocortisone  suppositories but finds them minimally effective. An old hydrocortisone  cream provides limited relief. He experiences bright red blood when wiping, attributed to a fissure.  He discusses his bowel habits, noting frequent loose stools and occasional hard stools leading to fissures. He does not typically strain during bowel movements and reports adequate water  intake. He does not currently take fiber supplements but acknowledges a family history of colon issues. He has undergone three colonoscopies, the most recent within the past 18 months, which were normal.  His social history includes frequent travel, with upcoming trips planned to [place]  and [place]. 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         OBJECTIVE:       07/25/2023    8:43 AM  Depression screen PHQ 2/9  Decreased Interest 0  Down, Depressed, Hopeless 0  PHQ - 2 Score 0     BP Readings from Last 3 Encounters:  07/31/24 118/72  08/23/23 118/80  07/25/23 102/70    BP 118/72   Pulse 72   Wt 166 lb (75.3 kg)   BMI 22.51 kg/m    Physical Exam   CHEST: Expiratory wheeze present. RECTAL: External hemorrhoid present, tender, covering canal.      Physical Exam Constitutional:      Appearance: Normal appearance.  Neurological:     General: No focal deficit present.     Mental Status: He is alert and oriented to person, place, and time. Mental status is at baseline.     ASSESSMENT/PLAN:   Assessment & Plan Influenza A  Hemorrhoids, unspecified hemorrhoid type  Wheezing  External hemorrhoid    Assessment and Plan    Acute influenza A infection with cough and wheezing Acute influenza A with expiratory wheeze and non-productive cough. Symptoms improved  but potential pneumonia risk noted. - Prescribed Medrol  Dosepak for inflammation and wheezing. - Prescribed Z-Pak (azithromycin ) if symptoms persist despite steroids. - Advised to monitor symptoms and use antibiotics if no improvement before travel. - Follow up with primary care provider on January 5th if needed.  External hemorrhoid with pain and bleeding External hemorrhoid with tenderness and bleeding, exacerbated by constipation. Previous internal hemorrhoids treated with banding. - Prescribed  Proctofoam up to three times daily for pain relief. - Has been on hydrocortisone  suppository daily for two weeks, mininmal improvement.  - Referred to general surgery for evaluation and potential banding. - Advised 15-minute delay between Proctofoam and hydrocortisone  applications.  Constipation contributing to hemorrhoidal symptoms Constipation contributing to hemorrhoidal symptoms despite loose stools. Possible paradoxical constipation due to inadequate fiber and water intake. - Recommended Miralax twice daily for three days, then daily. - Advised to increase fiber intake with Metamucil post-Miralax. - Encouraged adequate water intake. - Educated on fiber and water importance in bowel management.         Elsia Lasota A. Vita MD [facility] "

## 2024-08-05 NOTE — Patient Instructions (Incomplete)
" °  HEALTH MAINTENANCE RECOMMENDATIONS:  It is recommended that you get at least 30 minutes of aerobic exercise at least 5 days/week (for weight loss, you may need as much as 60-90 minutes). This can be any activity that gets your heart rate up. This can be divided in 10-15 minute intervals if needed, but try and build up your endurance at least once a week.  Weight bearing exercise is also recommended twice weekly.  Eat a healthy diet with lots of vegetables, fruits and fiber.  Colorful foods have a lot of vitamins (ie green vegetables, tomatoes, red peppers, etc).  Limit sweet tea, regular sodas and alcoholic beverages, all of which has a lot of calories and sugar.  Up to 2 alcoholic drinks daily may be beneficial for men (unless trying to lose weight, watch sugars).  Drink a lot of water.  Sunscreen of at least SPF 30 should be used on all sun-exposed parts of the skin when outside between the hours of 10 am and 4 pm (not just when at beach or pool, but even with exercise, golf, tennis, and yard work!)  Use a sunscreen that says broad spectrum so it covers both UVA and UVB rays, and make sure to reapply every 1-2 hours.  Remember to change the batteries in your smoke detectors when changing your clock times in the spring and fall.  Carbon monoxide detectors are recommended for your home.  Use your seat belt every time you are in a car, and please drive safely and not be distracted with cell phones and texting while driving.  Consider getting Prevnar-20 from the pharmacy, due to your asthma.  Switching to Asmanex  when you are out of your Flovent  is fine if that is less expensive. If not, I'm happy to refill the Flovent . Continue to rinse after use.  You currently have an internal hemorrhoid that prolapses. Stop using the hydrocortisone  cream and go back to the suppositories.  "

## 2024-08-05 NOTE — Progress Notes (Unsigned)
 No chief complaint on file.  Kevin Chan is a 49 y.o. male who presents for a complete physical.    He was seen by Dr. Vita 12/30 for ongoing respiratory concerns and hemorrhoid flare (external). He tested + for influenza B before Christmas.  He was prescribed a medrol  dosepak, and given a z-pak to use if not improving.  He was prescribed hydrocortisone  2.5% cream and suppository, as as Proctofoam.  ***  History of internal hemorrhoids, with h/o bleeding and itching.  Hyperlipidemia: taking lipitor 10 mg daily; requires daily coenzyme q10 to avoid the myalgias.  He hasn't tried without the CoQ10 in many years (he didn't tolerate Crestor in the past).  Continues on same diet as last year --less carbs, more protein (including beef 2-3x/week, infrequent eggs). Due for recheck. Lab Results  Component Value Date   CHOL 193 07/25/2023   HDL 70 07/25/2023   LDLCALC 104 (H) 07/25/2023   TRIG 109 07/25/2023   CHOLHDL 2.8 07/25/2023   He donates blood regularly (double red), 1-2 times/year, last time was in September. *** (Ensures that he hasn't donated for about 4 months before going skiing at [employer] soon ([place] , [place]).   Asthma: Typicall is well controlled with singulair  daily, and Flovent  HFA once daily, seasonally (spring), and BID when sick.  ***UPDATE--any flovent  with recent illness, or just steroids? Last albuterol ? ***   Allergic rhinitis-- He continues on singulair  daily. He no longer uses flonase .  He has been doing daily sinus saline rinses, along with triple dose mometasone rinse.  He is taking Zyrtec daily, and has Astelin  to use if needed during allergy season.  Insomnia--he has spurts of insomnia, where he has trouble turning his brain off at night, especially if he wakes up at 3am, thinks of all the things he needs to do.  This isn't frequent, sleeps pretty well most of the time. He has used Ambien  in the past, last for his flight to [place].  Very rarely takes 1/2 tablet if he knows he won't sleep. Last filled #15 in 12/2022.   Immunization History  Administered Date(s) Administered   Influenza Split 05/30/2009, 05/27/2010, 05/12/2011   Influenza, Seasonal, Injecte, Preservative Fre 05/13/2014   Influenza,inj,Quad PF,6+ Mos 04/21/2015, 05/17/2016, 05/30/2018   Influenza,inj,quad, With Preservative 05/19/2020   Influenza-Unspecified 04/21/2015, 05/18/2019, 05/15/2021, 06/28/2022, 05/12/2023   Moderna Sars-Covid-2 Vaccination 10/15/2019, 11/12/2019, 07/05/2020, 05/15/2021   Pneumococcal Polysaccharide-23 02/19/2016   Tdap 01/13/2009, 03/01/2018   Has gotten flu at pharmacy, declines another COVID booster Last colonoscopy: 06/17/22 Dr. Burnette, recheck 5 years Last PSA: never   Dentist: twice yearly   Ophtho: last year (has glasses for computer, doesn't wear after). Exercise:    gym  2-3x/week, 30 mins of elliptical and weight-bearing exercise. Skiing in the winter and golfs (walks the course more half the time). No longer does yoga (class was cancelled) ***     07/25/2023    8:43 AM 07/05/2022    8:27 AM 06/24/2021    8:43 AM 06/11/2020   10:01 AM 03/17/2020   10:43 AM  Depression screen PHQ 2/9  Decreased Interest 0 0 0 0 0  Down, Depressed, Hopeless 0 0 0 0 0  PHQ - 2 Score 0 0 0 0 0     PMH, PSH, SH and FH were reviewed and updated.     ROS: The patient denies anorexia, fever, headaches, vision loss, decreased hearing, ear pain, hoarseness, chest pain, palpitations, dizziness, syncope, dyspnea on exertion, cough, swelling, nausea, vomiting,  diarrhea, constipation, abdominal pain, melena, hematochezia, indigestion/heartburn, hematuria, incontinence, erectile dysfunction, nocturia, weakened urine stream, dysuria, genital lesions, joint pains, numbness, tingling, weakness, tremor, suspicious skin lesions, depression, anxiety, abnormal bleeding/bruising, or enlarged lymph nodes Sees dermatologist regularly.  L PF,  improving. Some triggering of R pinkie, not bothersome Allergies and asthma are controlled with his current regimen. Internal hemorrhoids aren't flaring. Intermittent insomnia (terminal) just sporadically per HPI    PHYSICAL EXAM:  There were no vitals taken for this visit.  Wt Readings from Last 3 Encounters:  07/31/24 166 lb (75.3 kg)  08/23/23 171 lb (77.6 kg)  07/25/23 170 lb 3.2 oz (77.2 kg)    General Appearance:   Alert, cooperative, no distress, appears stated age   Head:    Normocephalic, without obvious abnormality, atraumatic     Eyes:    PERRL, conjunctiva/corneas clear, EOM's intact, fundi benign    Ears:    Normal TM's and external ear canals     Nose:    No drainage, sinuses nontender  Throat:    Normal mucosa  Neck:    Supple, no lymphadenopathy; thyroid: no enlargement/ tenderness/nodules; no carotid bruit or JVD     Back:    Spine nontender, no curvature, ROM normal, no CVA tenderness     Lungs:    Clear to auscultation bilaterally without wheezes, rales or ronchi; respirations unlabored     Chest Wall:    No tenderness or deformity     Heart:    Regular rate and rhythm (mild bradycardia), S1 and S2 normal, no murmur, rub or gallop     Breast Exam:    No chest wall tenderness, masses or gynecomastia     Abdomen:    Soft, non-tender, nondistended, normoactive bowel sounds,   no masses, no hepatosplenomegaly     Genitalia:    Normal male external genitalia without lesions. Circumcised. Testicles without masses. 1cm nodule felt superior/posterior to the left testicle, nontender, unchanged.  No inguinal hernias.   Rectal:    Normal sphincter tone, no masses.  Prostate is not enlarged, no nodules or masses. No stool in vault for heme testing.***  Extremities:    No clubbing, cyanosis or edema. Triggering of R pinkie    Pulses:    2+ and symmetric all extremities     Skin:    Skin color, texture, turgor normal, no rashes or lesions.   Lymph nodes:    Cervical,  supraclavicular, and inguinal nodes normal     Neurologic:    Normal strength, sensation and gait; reflexes 2+ and symmetric throughout                              Psych:   Normal mood, affect, hygiene and grooming   ***UPDATE--triggering R pinkie? Nodule L scrotum? Rectal exam, hemorrhoids? Stool in vault?  Done?? ***mild brady? Update if not   ASSESSMENT/PLAN:  If hemorrhoids painful/flaring, we can skip prostate exam   Lipid, cbc, c-met  Enter flu shot. Offer/decline COVID Consider prevnar-20 (d/t asthma) vs waiting until age 11  ***RF STATIN AFTER LABS BACK   Recommended at least 30 minutes of aerobic activity at least 5 days/week, weight-bearing exercise at least 2x/week; proper sunscreen use reviewed; healthy diet and alcohol recommendations (less than or equal to 2 drinks/day) reviewed; regular seatbelt use; changing batteries in smoke detectors.  Immunization recommendations discussed--continue flu shots yearly. Declined COVID booster (doesn't have insurance coverage). *** Colonoscopy recommendations  reviewed, UTD.   F/u 1 year, sooner prn

## 2024-08-06 ENCOUNTER — Ambulatory Visit: Payer: Self-pay | Admitting: Family Medicine

## 2024-08-06 ENCOUNTER — Encounter: Payer: Self-pay | Admitting: Family Medicine

## 2024-08-06 VITALS — BP 102/66 | HR 72 | Ht 72.0 in | Wt 166.8 lb

## 2024-08-06 DIAGNOSIS — K649 Unspecified hemorrhoids: Secondary | ICD-10-CM

## 2024-08-06 DIAGNOSIS — E7800 Pure hypercholesterolemia, unspecified: Secondary | ICD-10-CM

## 2024-08-06 DIAGNOSIS — Z0000 Encounter for general adult medical examination without abnormal findings: Secondary | ICD-10-CM

## 2024-08-06 DIAGNOSIS — J309 Allergic rhinitis, unspecified: Secondary | ICD-10-CM

## 2024-08-06 DIAGNOSIS — Z5181 Encounter for therapeutic drug level monitoring: Secondary | ICD-10-CM

## 2024-08-06 DIAGNOSIS — J45909 Unspecified asthma, uncomplicated: Secondary | ICD-10-CM

## 2024-08-06 LAB — CBC WITH DIFFERENTIAL/PLATELET
Basophils Absolute: 0 x10E3/uL (ref 0.0–0.2)
Basos: 0 %
EOS (ABSOLUTE): 0 x10E3/uL (ref 0.0–0.4)
Eos: 0 %
Hematocrit: 47.1 % (ref 37.5–51.0)
Hemoglobin: 15.8 g/dL (ref 13.0–17.7)
Immature Grans (Abs): 0.3 x10E3/uL — ABNORMAL HIGH (ref 0.0–0.1)
Immature Granulocytes: 4 %
Lymphocytes Absolute: 2.1 x10E3/uL (ref 0.7–3.1)
Lymphs: 21 %
MCH: 29.8 pg (ref 26.6–33.0)
MCHC: 33.5 g/dL (ref 31.5–35.7)
MCV: 89 fL (ref 79–97)
Monocytes Absolute: 0.8 x10E3/uL (ref 0.1–0.9)
Monocytes: 8 %
Neutrophils Absolute: 6.6 x10E3/uL (ref 1.4–7.0)
Neutrophils: 67 %
Platelets: 425 x10E3/uL (ref 150–450)
RBC: 5.3 x10E6/uL (ref 4.14–5.80)
RDW: 13.2 % (ref 11.6–15.4)
WBC: 9.8 x10E3/uL (ref 3.4–10.8)

## 2024-08-06 LAB — COMPREHENSIVE METABOLIC PANEL WITH GFR
ALT: 35 IU/L (ref 0–44)
AST: 16 IU/L (ref 0–40)
Albumin: 4.4 g/dL (ref 4.1–5.1)
Alkaline Phosphatase: 78 IU/L (ref 47–123)
BUN/Creatinine Ratio: 23 — ABNORMAL HIGH (ref 9–20)
BUN: 19 mg/dL (ref 6–24)
Bilirubin Total: 0.4 mg/dL (ref 0.0–1.2)
CO2: 24 mmol/L (ref 20–29)
Calcium: 9.9 mg/dL (ref 8.7–10.2)
Chloride: 99 mmol/L (ref 96–106)
Creatinine, Ser: 0.83 mg/dL (ref 0.76–1.27)
Globulin, Total: 2.9 g/dL (ref 1.5–4.5)
Glucose: 98 mg/dL (ref 70–99)
Potassium: 4.5 mmol/L (ref 3.5–5.2)
Sodium: 138 mmol/L (ref 134–144)
Total Protein: 7.3 g/dL (ref 6.0–8.5)
eGFR: 109 mL/min/1.73

## 2024-08-06 LAB — LIPID PANEL
Chol/HDL Ratio: 3.6 ratio (ref 0.0–5.0)
Cholesterol, Total: 183 mg/dL (ref 100–199)
HDL: 51 mg/dL
LDL Chol Calc (NIH): 108 mg/dL — ABNORMAL HIGH (ref 0–99)
Triglycerides: 137 mg/dL (ref 0–149)
VLDL Cholesterol Cal: 24 mg/dL (ref 5–40)

## 2024-08-06 MED ORDER — ASMANEX (60 METERED DOSES) 220 MCG/ACT IN AEPB: 2.0000 | INHALATION_SPRAY | Freq: Every day | RESPIRATORY_TRACT | 11 refills | Status: AC

## 2024-08-07 ENCOUNTER — Ambulatory Visit: Payer: Self-pay | Admitting: Family Medicine

## 2024-08-07 MED ORDER — ATORVASTATIN CALCIUM 10 MG PO TABS: 10.0000 mg | ORAL_TABLET | Freq: Every day | ORAL | 3 refills | Status: AC

## 2024-08-21 ENCOUNTER — Other Ambulatory Visit: Payer: Self-pay | Admitting: Family Medicine

## 2024-08-21 DIAGNOSIS — J45909 Unspecified asthma, uncomplicated: Secondary | ICD-10-CM

## 2024-08-21 DIAGNOSIS — J309 Allergic rhinitis, unspecified: Secondary | ICD-10-CM

## 2025-08-21 ENCOUNTER — Encounter: Payer: Self-pay | Admitting: Family Medicine
